# Patient Record
Sex: Female | Born: 1940 | Race: White | Hispanic: No | Marital: Married | Smoking: Never smoker
Health system: Southern US, Community
[De-identification: ages and names within clinical notes are randomized; demographics above are authoritative.]

## PROBLEM LIST (undated history)

## (undated) DIAGNOSIS — M810 Age-related osteoporosis without current pathological fracture: Secondary | ICD-10-CM

## (undated) DIAGNOSIS — G709 Myoneural disorder, unspecified: Secondary | ICD-10-CM

## (undated) DIAGNOSIS — H269 Unspecified cataract: Secondary | ICD-10-CM

## (undated) DIAGNOSIS — D689 Coagulation defect, unspecified: Secondary | ICD-10-CM

## (undated) DIAGNOSIS — E785 Hyperlipidemia, unspecified: Secondary | ICD-10-CM

## (undated) DIAGNOSIS — IMO0002 Reserved for concepts with insufficient information to code with codable children: Secondary | ICD-10-CM

## (undated) DIAGNOSIS — C801 Malignant (primary) neoplasm, unspecified: Secondary | ICD-10-CM

## (undated) DIAGNOSIS — J45909 Unspecified asthma, uncomplicated: Secondary | ICD-10-CM

## (undated) DIAGNOSIS — E079 Disorder of thyroid, unspecified: Secondary | ICD-10-CM

## (undated) DIAGNOSIS — I509 Heart failure, unspecified: Secondary | ICD-10-CM

## (undated) DIAGNOSIS — I10 Essential (primary) hypertension: Secondary | ICD-10-CM

## (undated) DIAGNOSIS — T7840XA Allergy, unspecified, initial encounter: Secondary | ICD-10-CM

## (undated) DIAGNOSIS — E119 Type 2 diabetes mellitus without complications: Secondary | ICD-10-CM

## (undated) DIAGNOSIS — K219 Gastro-esophageal reflux disease without esophagitis: Secondary | ICD-10-CM

## (undated) DIAGNOSIS — M1990 Unspecified osteoarthritis, unspecified site: Secondary | ICD-10-CM

## (undated) DIAGNOSIS — Z5189 Encounter for other specified aftercare: Secondary | ICD-10-CM

## (undated) DIAGNOSIS — I639 Cerebral infarction, unspecified: Secondary | ICD-10-CM

## (undated) HISTORY — DX: Disorder of thyroid, unspecified: E07.9

## (undated) HISTORY — DX: Unspecified cataract: H26.9

## (undated) HISTORY — DX: Age-related osteoporosis without current pathological fracture: M81.0

## (undated) HISTORY — DX: Unspecified osteoarthritis, unspecified site: M19.90

## (undated) HISTORY — DX: Allergy, unspecified, initial encounter: T78.40XA

## (undated) HISTORY — DX: Type 2 diabetes mellitus without complications: E11.9

## (undated) HISTORY — DX: Coagulation defect, unspecified: D68.9

## (undated) HISTORY — DX: Hyperlipidemia, unspecified: E78.5

## (undated) HISTORY — DX: Myoneural disorder, unspecified: G70.9

## (undated) HISTORY — PX: FOOT SURGERY: SHX648

## (undated) HISTORY — DX: Cerebral infarction, unspecified: I63.9

## (undated) HISTORY — DX: Essential (primary) hypertension: I10

## (undated) HISTORY — DX: Heart failure, unspecified: I50.9

## (undated) HISTORY — DX: Unspecified asthma, uncomplicated: J45.909

## (undated) HISTORY — DX: Malignant (primary) neoplasm, unspecified: C80.1

## (undated) HISTORY — DX: Encounter for other specified aftercare: Z51.89

## (undated) HISTORY — PX: KNEE SURGERY: SHX244

## (undated) HISTORY — DX: Reserved for concepts with insufficient information to code with codable children: IMO0002

## (undated) HISTORY — DX: Gastro-esophageal reflux disease without esophagitis: K21.9

---

## 2007-01-10 ENCOUNTER — Inpatient Hospital Stay (HOSPITAL_COMMUNITY): Admission: AD | Admit: 2007-01-10 | Discharge: 2007-01-13 | Payer: Self-pay

## 2007-01-12 ENCOUNTER — Encounter (INDEPENDENT_AMBULATORY_CARE_PROVIDER_SITE_OTHER): Payer: Self-pay | Admitting: Specialist

## 2011-01-17 NOTE — Op Note (Signed)
NAMETAYLORANN, TKACH NO.:  [id number]   MEDICAL RECORD NO.:  [id number]          PATIENT TYPE:  INP   LOCATION:  5012                         FACILITY:  [facility]   PHYSICIAN:  Alvan Dame, D.P.M. DATE OF BIRTH:  Sep 26, 1940   DATE OF PROCEDURE:  01/12/2007  DATE OF DISCHARGE:                               OPERATIVE REPORT   SURGEON:  Alvan Dame, D.P.M.   PREOPERATIVE DIAGNOSIS:  Osteomyelitis, third toe, right foot.   POSTOPERATIVE DIAGNOSIS:  Osteomyelitis, third toe, right foot.   OPERATIVE PROCEDURE:  Amputation third toe, right foot.   INDICATIONS FOR SURGERY:  The patient has had a several day history of  increased pain, tenderness, difficulty, and synovitis of her right foot  and right great toe with an ulceration being present for more than a  month.  The patient has recently been placed on antibiotics and admitted  for IV antibiotic therapy.  MRI has confirmed osteomyelitic change of  the distal aspects of the toe.  There looked like synovitis and  degenerative changes of the forefoot midfoot, although no extending  osteomyelitis.  At this time, per my recommendation and patient request,  amputation of the third toe right foot is to proceed.  No other  contraindications noted.   ANESTHESIA:  Managed anesthesia care, MAC, with local anesthetic  administered a total of 10 mL 0.5% Marcaine plain in a regional block  fashion.   HEMOSTASIS:  Use of a right ankle tourniquet, 250 mmHg, approximately 15  minutes.   FINDINGS AND PROCEDURES:  The patient is brought to the OR and placed on  the table in the supine position.  IV sedation was established.  Local  anesthetic was administered.  The foot was then examined.  The Esmarch  wrap and ankle tourniquet inflated to 250 mmHg.  The following procedure  was then carried out.  Amputation of the third toe, right foot, and  along the capillaries.  Attention was directed to the third digit of the  right  foot where a distal ulceration was identified.  The digit was  incised circumferentially at its base with preservation of intact  plantar skin to produce a slight flap for closure.  At this time, the  incision was made circumferentially around the base of the digit with  both dorsal and lateral flap preservation.  The incision was carried  down deeper to the level of the capsular periosteal structures of the  bone with hemostasis being acquired as necessary.  At this time, power  instrumentation was utilized to osteotomize the bone at the base of the  proximal phalanx.  The proximal phalanx and the entire third toe were  excised from the site and submitted for pathology analysis.  At this  time, cultures were taken, both aerobic and anaerobic cultures of  remaining amputation site.  This site was then lavaged with sterile  antibiotic solution and thoroughly lavaged and cleansed.  The closure  was accomplished as follows.  Vicryl 4-0 was utilized to reapproximate  capsule and periosteum over the stump portion of the phalanx and skin  was then reapproximated  with 4-0 nylon in a simple interrupted fashion.  Betadine, Adaptic, and a dry sterile compressive dressing were applied  to the right forefoot.  Ankle tourniquet deflated with immediate return  of perfusion to all remaining digits being noted at this time.   The patient is returned from the OR to recovery in satisfactory  condition.  Discharged per Anesthesia to room in the service of  [facility] team, Dr. Lavera Guise, for medical management.  The plan is for the  patient to be discharged to home either on IV or p.o. antibiotics within  24-48 hours.  The patient tolerated the procedure well and will be  monitored until discharged from hospital.           ______________________________  Alvan Dame, D.P.M.     RS/MEDQ  D:  01/12/2007  T:  01/12/2007  Job:  [id number]

## 2011-01-17 NOTE — H&P (Signed)
NAME:  Stephanie Brock, Stephanie Brock NO.:  [id number]   MEDICAL RECORD NO.:  [id number]          PATIENT TYPE:  INP   LOCATION:  5012                         FACILITY:  [facility]   PHYSICIAN:  Lonia Blood, M.D.       DATE OF BIRTH:  September 04, 1941   DATE OF ADMISSION:  01/10/2007  DATE OF DISCHARGE:                              HISTORY & PHYSICAL   PRIMARY CARE PHYSICIAN:  Stephanie Brock at [facility], which makes  the patient unassigned for [facility].   CHIEF COMPLAINT:  Right foot ulcer/4 pain.   HISTORY OF PRESENT ILLNESS:  Stephanie Brock is a 70 year old woman with  history of diabetes and chronic left foot ulcer who presented Stephanie Brock office yesterday with complaints of new onset right foot ulcer.  The patient was admitted to [facility] for possible amputation.  We were  called to fully evaluate the patient prior to the planned surgery.  The  patient reports that about 10 hours prior to admission she also noticed  that the right lower extremity was getting redder and that was getting  warmer, and also the redness and the warmth are spreading up the leg.  She did not think that that was something important to mention to Stephanie Brock, but showed up here at [facility] for the admission.  The patient  reports that she had an episode of chills about a couple days ago. She  denies any fever, and she denies any nausea, vomiting, or abdominal  pain.   PAST MEDICAL HISTORY:  1. Diabetes mellitus type 2, with neuropathy, now insulin requiring.  2. Hypertension.  3. Hyperlipidemia.  4. Hypothyroidism.  5. CVA in 2006, with right hemiparesis, almost completely recovered.  6. Obesity.  7. Diastolic heart failure.  8. History of chest pain, with a cardiac catheterization that was      about 8 years ago, and it was clean.  9. Tonsillectomy.  10.Ovarian cyst rupture  11.Appendectomy.  12.Bilateral tubal ligation.  13.History of complex foot surgeries on the right foot  about 17 years      ago.  14.Restless leg syndrome.  15.Obstructive sleep apnea.  16.Chronic ulcer of the left foot.   HOME MEDICATIONS:  1. Ramipril 2.5 mg by mouth twice a day.  2. Isosorbide 40 mg by mouth twice a day.  3. Lasix 120 mg daily.  4. Triamterene/HCTZ 25/37.5 mg once a day.  5. Synthroid 75 mcg daily  6. Protonix 40 mg at bedtime.  7. Metoprolol 200 mg daily.  8. Spironolactone 25 mg daily.  9. Aggrenox 1 tablet twice a day.  10.Lyrica 100 mg daily.  11.Neurontin 600 mg 3 times a day.  12.Potassium twice a day.  13.Lantus 54 units at bedtime.  14.NovoLog sliding scale with meals.  15.Avelox 400 mg daily.  16.Septra Double Strength 1 tablet twice a day.   SOCIAL HISTORY:  The patient lives with her second husband in [place], [place].  She is retired.  She does not smoke cigarettes,  does not drink alcohol.  She has three grown-up  children.  She is still  very active in her community.   FAMILY HISTORY:  Positive for coronary artery disease, diabetes in the  father, positive for emphysema in the mother. Both her parents are  deceased.  The patient has three siblings.  They have various medical  problems, and her younger sister has diabetes.   REVIEW OF SYSTEMS:  As per HPI.  Also, the patient reports occasional  chest pain that does not seem to be effort related.  She also reports  dyspnea after seeing significant exertion.  She reports occasional  headaches.  Other systems are negative.   PHYSICAL EXAMINATION UPON ADMISSION:  VITAL SIGNS:  Temperature 97.1,  heart rate 68, respirations 20, blood pressure 95/58, saturation 94% on  room air.  GENERAL APPEARANCE:  An obese, alert, and oriented woman, in no acute  distress, sitting in bed.  She is oriented to place, person time, and  situation.  HEENT:  Her head appears normocephalic, atraumatic.  Eyes: Pupils equal,  round, and reactive to light and accommodation.  Extraocular movements  intact.   Throat clear.  NECK:  Supple.  No JVD.  No carotid bruits.  CHEST:  Clear to auscultation bilaterally, without wheezes, rhonchi, or  crackles.  HEART:  Regular rate and rhythm, without murmurs, rubs, or gallops.  ABDOMEN:  Obese, soft, nontender.  Bowel sounds are present.  LOWER EXTREMITIES: The right lower extremity has +1 edema and is warm to  touch.  There is erythema extending two-thirds up through the leg.  DP  and PT pulses are present and bouncing. On the right foot and third toe,  there is a shallow area of ulceration, with cellulitis right on the tip  of the toe. On the left lower extremity, there is no erythema, no  warmth, but there is a +1 edema.  Pulses are present and good in the  left lower extremity as well.  NEUROLOGIC:  Cranial nerves III-XII seem to be intact.  There might be a  tinge of a slight left facial droop. The right side has 4.5/5 strength,  compared to 5/5 in the left side.  Gait seems to be wide based, but  otherwise within normal limits.   The patient's labs are all pending currently.   ASSESSMENT AND PLAN:  1. Right third toe probable osteomyelitis, with right lower extremity      cellulitis.  Stephanie Brock will be admitted to the acute care unit of      [facility].  Blood cultures and wound culture will be      obtained.  We will cover the patient empirically with vancomycin      and Avelox, to cover for MRSA and gram-negative rods.  The      patient's right foot will be imaged using an MRI to look for      osteomyelitis.  We will also follow clinically the response of the      cellulitis to the intravenous antibiotic..  2. Diabetes mellitus.  This probably is under good control, given the      patient's medical compliance.  We will obtain a hemoglobin A1c is      to further document that, and keep the patient on sliding scale      insulin and Lantus during her hospitalization. 3. History of chest pains. It is conceivable that Stephanie Brock may have  a      degree of chest pain.  She does seem to be on maximal  medical      therapy, and currently her situation seems to be stable.  I will      obtain an EKG to have a baseline, but I would say that at this      point in time she should be safe to undergo amputation if      necessary.  For now, I will continue the patient's beta blocker,      aspirin, and not use the diuretics until we get a better picture of      how the cellulitis will react.  The patient is considerably at risk      of sepsis, and we do not want her to be hypotensive from all the      diuretics that we will be giving her.  4. History of a stroke.  The patient's neurological exam seems to be      at baseline, according to her. I will continue her Aggrenox and add      also low-dose aspirin for cardioprotection.  5. Obstructive sleep apnea.  Ms. Modeste will be placed back on CPAP at      bedtime as she was supposed to be.      Lonia Blood, M.D.  Electronically Signed     SL/MEDQ  D:  01/10/2007  T:  01/11/2007  Job:  [id number]   cc:   Alvan Dame, D.P.M.

## 2011-01-17 NOTE — Discharge Summary (Signed)
Stephanie Brock, Stephanie Brock NO.:  [id number]   MEDICAL RECORD NO.:  [id number]          PATIENT TYPE:  INP   LOCATION:  5012                         FACILITY:  [facility]   PHYSICIAN:  Lonia Blood, M.D.       DATE OF BIRTH:  1940-10-18   DATE OF ADMISSION:  01/10/2007  DATE OF DISCHARGE:  01/13/2007                               DISCHARGE SUMMARY   PRIMARY CARE PHYSICIAN:  Dr. Danae Chen at [facility].   DISCHARGE DIAGNOSES:  1. Right foot third toe osteomyelitis.  2. Cellulitis of the right lower extremity.  Resolved.  3. Diabetes mellitus type 2, uncontrolled.  4. Diabetic neuropathy.  5. Hypertension.  6. Hyperlipidemia.  7. Hypothyroidism.  8. Status post stroke.  9. Obesity.  10.Diastolic heart failure.  11.Restless legs syndrome.  12.Obstructive sleep apnea.   DISCHARGE MEDICATIONS:  1. Enalapril  2.5 mg per mouth twice a day.  2. Isosorbide 40 mg by mouth twice a day.  3. Lasix 80 mg daily.  4. Synthroid 75 mcg daily.  5. Protonix 40 mg daily.  6. Metoprolol 200 mg daily.  7. Aggrenox 1 tablet twice a day.  8. Lyrica 100 mg three times a day.  9. Neurontin 600 mg three times a day.  10.Potassium chloride twice a day.  11.Lantus 60 units at bedtime.  12.NovoLog sliding scale insulin with each meal.  13.Septra double-strength 2 tablets by mouth twice a day.   CONDITION ON DISCHARGE:  Stephanie Brock was discharged in good condition.   At time of discharge she was instructed to follow up with Dr. Ralene Cork and  with Dr. Danae Chen.   PROCEDURES DURING THIS ADMISSION:  1. On Jan 11, 2007, the patient underwent a MRI of the right lower      extremity with findings of osteomyelitis and cellulitis involving      the third digit of the right foot.  2. Jan 12, 2007, amputation of the third toe of the right foot by Dr.      Ralene Cork.   CONSULTATIONS DURING THIS ADMISSION:  The patient was seen consultation  by Dr. Ralene Cork from podiatry.   HOSPITAL COURSE:  1.  Cellulitis and osteomyelitis of the right lower extremity.  Mrs.      Brock was admitted to acute care at [facility].  The      patient had blood culture and urine culture obtained, the results      of which are still pending.  Stephanie Brock was treated empirically      with vancomycin and Avelox and she had a course of quick recovery.      She underwent amputation of the third toe with a good postoperative      course.  Stephanie Brock was switched to oral antibiotics as her      cellulitis is resolving.  She will complete a 1-week course of      Septra which should be good coverage empirically for this patient's      problem.  2. Diabetes mellitus.  Poorly controlled with an admitting hemoglobin  A1C of 8.  I discussed with Stephanie Brock dietary compliance, losing      weight, and I have increased her dose of Lantus.  3. Hyperlipidemia.  Stephanie Brock is not on a Statin.  She reports that      she has been on a Statin before, but her cholesterol got extremely      low, so her primary care physician took her off.  Stephanie Brock just      had a routine fasting lipid panel through her primary care      physician's office and she is due for a follow up visit next week.      I have encouraged the patient to discuss the results of the fasting      lipid panel with her primary care physician and to strongly      consider using a Statin, as she is a diabetic who already had a      stroke.  4. Multiple chronic medical problems including hypertension, diastolic      heart failure, hypothyroidism.  All these problems have been stable      throughout this admission without any requirement of changing of      any medications.      Lonia Blood, M.D.  Electronically Signed     SL/MEDQ  D:  01/13/2007  T:  01/13/2007  Job:  [id number]

## 2013-06-04 ENCOUNTER — Ambulatory Visit (INDEPENDENT_AMBULATORY_CARE_PROVIDER_SITE_OTHER): Payer: Medicare Other

## 2013-06-04 VITALS — BP 133/114 | HR 70 | Temp 97.1°F | Resp 16 | Ht 64.0 in | Wt 198.0 lb

## 2013-06-04 DIAGNOSIS — E1049 Type 1 diabetes mellitus with other diabetic neurological complication: Secondary | ICD-10-CM

## 2013-06-04 DIAGNOSIS — L97919 Non-pressure chronic ulcer of unspecified part of right lower leg with unspecified severity: Secondary | ICD-10-CM

## 2013-06-04 DIAGNOSIS — L97909 Non-pressure chronic ulcer of unspecified part of unspecified lower leg with unspecified severity: Secondary | ICD-10-CM

## 2013-06-04 DIAGNOSIS — E1040 Type 1 diabetes mellitus with diabetic neuropathy, unspecified: Secondary | ICD-10-CM

## 2013-06-04 DIAGNOSIS — L97509 Non-pressure chronic ulcer of other part of unspecified foot with unspecified severity: Secondary | ICD-10-CM

## 2013-06-04 DIAGNOSIS — B351 Tinea unguium: Secondary | ICD-10-CM

## 2013-06-04 NOTE — Progress Notes (Signed)
Mrs. Mack is seen for f/u of ulcers both feet.  Sub 4th met ulcer on left resolved, no drainage. Sub 1st ulcer 1 cm diameter .3cm depth, still draining, with macerated keratosis build up. Vitals stable , afebrile. With warm right lower leg, and hx of cellulitis. A/p Diabetic ulcer sub 1 right.   Debridement, sharp of keratosis and necrotic tissue. silvadine and dsd applied. rov 2 weelks. Pt reports that she stopped her antibiotics,  Will resume per insrtucions today, Doxycycline 100 mg. Bid.  Tangelia Sanson [employer]

## 2013-06-04 NOTE — Patient Instructions (Signed)

## 2013-06-18 ENCOUNTER — Ambulatory Visit (INDEPENDENT_AMBULATORY_CARE_PROVIDER_SITE_OTHER): Payer: Medicare Other

## 2013-06-18 VITALS — BP 112/53 | HR 65 | Resp 16

## 2013-06-18 DIAGNOSIS — L97509 Non-pressure chronic ulcer of other part of unspecified foot with unspecified severity: Secondary | ICD-10-CM

## 2013-06-18 DIAGNOSIS — E1142 Type 2 diabetes mellitus with diabetic polyneuropathy: Secondary | ICD-10-CM

## 2013-06-18 DIAGNOSIS — E1049 Type 1 diabetes mellitus with other diabetic neurological complication: Secondary | ICD-10-CM

## 2013-06-18 DIAGNOSIS — E1040 Type 1 diabetes mellitus with diabetic neuropathy, unspecified: Secondary | ICD-10-CM

## 2013-06-18 NOTE — Progress Notes (Signed)
  Subjective:    Patient ID: Stephanie Brock, female    DOB: 1940-09-07, 72 y.o.   MRN: [id number]  HPI right foot is still draining and bleeding and is sore and tender when i get up and the left foot is good    Review of Systems  Constitutional: Negative.   HENT: Negative.   Eyes: Negative.   Respiratory: Negative.   Cardiovascular: Negative.   Gastrointestinal: Negative.   Endocrine: Negative.   Genitourinary: Negative.   Musculoskeletal: Negative.   Skin: Negative.   Allergic/Immunologic: Negative.   Neurological: Negative.   Hematological: Negative.   Psychiatric/Behavioral: Negative.        Objective:   Physical Exam  Constitutional: She is oriented to person, place, and time. She appears well-developed and well-nourished.  Cardiovascular:  Pulses:      Dorsalis pedis pulses are 2+ on the right side, and 2+ on the left side.       Posterior tibial pulses are 0 on the right side, and 0 on the left side.  Very refill 3-4 seconds all digits. Moderate varicosities bilateral. Mild edema +1 right more so than the  Musculoskeletal:  Severe rigid digital contractures 2345 bilateral severe HAV deformity and hallux malleus deformities bilateral with interphalangeus both hallux. Severe osteoarthrosis as well as bunion deformity and hammertoe deformities bilateral plantarflexed metatarsal associate ulceration sub-first right ulceration sub-fourth left resolve at this time  Neurological: She is alert and oriented to person, place, and time. She has normal strength.  Grossly significant neuropathy with absent epicritic and proprioceptive sensations bilateral. Abnormal function is well bilateral. DTRs not was  Skin: Skin is warm and dry. No cyanosis. Nails show no clubbing.  Nails criptotic with friability discoloration and brittleness consistent onychomycosis bilateral there is a healed ulceration sub-fourth MTP her left with some surrounding keratoses. There still is a 1 x 1.4 cm  ulceration a half centimeter depth sub-first MTP area right. There is some maceration in keratoses which is debrided at this time. No malodor no ascending cellulitis  Psychiatric: She has a normal mood and affect. Her behavior is normal.          Assessment & Plan:  Diabetic neuropathic ulceration sub-1 right stable minimal change in size. Ulceration sub-fourth left is resolved in stable. 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 The ulcer site was debrided dressed with BioSorb and gauze dressing sub-1 right recheck in 2 weeks for followup. We'll consider a wounds or alternative her skin substitute at future followup visits. Maintain offloading with appropriate shoes and ankle bracing.  Alvan Dame to the

## 2013-06-18 NOTE — Patient Instructions (Signed)

## 2013-06-23 ENCOUNTER — Inpatient Hospital Stay (HOSPITAL_COMMUNITY)
Admission: EM | Admit: 2013-06-23 | Discharge: 2013-06-26 | DRG: 617 | Disposition: A | Discharge status: Home Health | Payer: Medicare Other | Attending: Internal Medicine | Admitting: Internal Medicine

## 2013-06-23 ENCOUNTER — Inpatient Hospital Stay (HOSPITAL_COMMUNITY): Payer: Medicare Other

## 2013-06-23 ENCOUNTER — Emergency Department (HOSPITAL_COMMUNITY): Payer: Medicare Other

## 2013-06-23 ENCOUNTER — Telehealth: Payer: Self-pay | Admitting: *Deleted

## 2013-06-23 ENCOUNTER — Encounter (HOSPITAL_COMMUNITY): Payer: Self-pay | Admitting: Emergency Medicine

## 2013-06-23 DIAGNOSIS — E1142 Type 2 diabetes mellitus with diabetic polyneuropathy: Secondary | ICD-10-CM | POA: Diagnosis present

## 2013-06-23 DIAGNOSIS — L03039 Cellulitis of unspecified toe: Secondary | ICD-10-CM | POA: Diagnosis present

## 2013-06-23 DIAGNOSIS — E785 Hyperlipidemia, unspecified: Secondary | ICD-10-CM | POA: Diagnosis present

## 2013-06-23 DIAGNOSIS — M810 Age-related osteoporosis without current pathological fracture: Secondary | ICD-10-CM | POA: Diagnosis present

## 2013-06-23 DIAGNOSIS — Z880 Allergy status to penicillin: Secondary | ICD-10-CM

## 2013-06-23 DIAGNOSIS — IMO0001 Reserved for inherently not codable concepts without codable children: Secondary | ICD-10-CM

## 2013-06-23 DIAGNOSIS — E1140 Type 2 diabetes mellitus with diabetic neuropathy, unspecified: Secondary | ICD-10-CM

## 2013-06-23 DIAGNOSIS — Z794 Long term (current) use of insulin: Secondary | ICD-10-CM

## 2013-06-23 DIAGNOSIS — E11628 Type 2 diabetes mellitus with other skin complications: Secondary | ICD-10-CM | POA: Diagnosis present

## 2013-06-23 DIAGNOSIS — L02619 Cutaneous abscess of unspecified foot: Secondary | ICD-10-CM | POA: Diagnosis present

## 2013-06-23 DIAGNOSIS — L0390 Cellulitis, unspecified: Secondary | ICD-10-CM

## 2013-06-23 DIAGNOSIS — D649 Anemia, unspecified: Secondary | ICD-10-CM

## 2013-06-23 DIAGNOSIS — E119 Type 2 diabetes mellitus without complications: Secondary | ICD-10-CM

## 2013-06-23 DIAGNOSIS — I509 Heart failure, unspecified: Secondary | ICD-10-CM

## 2013-06-23 DIAGNOSIS — Z8673 Personal history of transient ischemic attack (TIA), and cerebral infarction without residual deficits: Secondary | ICD-10-CM

## 2013-06-23 DIAGNOSIS — E1169 Type 2 diabetes mellitus with other specified complication: Principal | ICD-10-CM | POA: Diagnosis present

## 2013-06-23 DIAGNOSIS — L97509 Non-pressure chronic ulcer of other part of unspecified foot with unspecified severity: Secondary | ICD-10-CM | POA: Diagnosis present

## 2013-06-23 DIAGNOSIS — K219 Gastro-esophageal reflux disease without esophagitis: Secondary | ICD-10-CM

## 2013-06-23 DIAGNOSIS — E039 Hypothyroidism, unspecified: Secondary | ICD-10-CM

## 2013-06-23 DIAGNOSIS — I5032 Chronic diastolic (congestive) heart failure: Secondary | ICD-10-CM

## 2013-06-23 DIAGNOSIS — L089 Local infection of the skin and subcutaneous tissue, unspecified: Secondary | ICD-10-CM

## 2013-06-23 DIAGNOSIS — E1149 Type 2 diabetes mellitus with other diabetic neurological complication: Secondary | ICD-10-CM | POA: Diagnosis present

## 2013-06-23 DIAGNOSIS — Z79899 Other long term (current) drug therapy: Secondary | ICD-10-CM

## 2013-06-23 DIAGNOSIS — E11621 Type 2 diabetes mellitus with foot ulcer: Secondary | ICD-10-CM

## 2013-06-23 DIAGNOSIS — I10 Essential (primary) hypertension: Secondary | ICD-10-CM

## 2013-06-23 LAB — CBC WITH DIFFERENTIAL/PLATELET
Basophils Absolute: 0 10*3/uL (ref 0.0–0.1)
Eosinophils Relative: 1 % (ref 0–5)
Hemoglobin: 10.9 g/dL — ABNORMAL LOW (ref 12.0–15.0)
Lymphocytes Relative: 11 % — ABNORMAL LOW (ref 12–46)
Lymphs Abs: 1.6 10*3/uL (ref 0.7–4.0)
MCH: 30.9 pg (ref 26.0–34.0)
MCHC: 32.3 g/dL (ref 30.0–36.0)
Monocytes Absolute: 1.5 10*3/uL — ABNORMAL HIGH (ref 0.1–1.0)
RBC: 3.53 MIL/uL — ABNORMAL LOW (ref 3.87–5.11)

## 2013-06-23 LAB — BASIC METABOLIC PANEL
BUN: 13 mg/dL (ref 6–23)
CO2: 27 mEq/L (ref 19–32)
Calcium: 9.4 mg/dL (ref 8.4–10.5)
Creatinine, Ser: 1.05 mg/dL (ref 0.50–1.10)
Glucose, Bld: 143 mg/dL — ABNORMAL HIGH (ref 70–99)

## 2013-06-23 LAB — HEMOGLOBIN A1C
Hgb A1c MFr Bld: 8 % — ABNORMAL HIGH (ref <5.7)
Mean Plasma Glucose: 183 mg/dL — ABNORMAL HIGH (ref <117)

## 2013-06-23 LAB — C-REACTIVE PROTEIN: CRP: 29.8 mg/dL — ABNORMAL HIGH (ref <0.60)

## 2013-06-23 MED ORDER — HEPARIN SODIUM (PORCINE) 5000 UNIT/ML IJ SOLN
5000.0000 [IU] | Freq: Three times a day (TID) | INTRAMUSCULAR | Status: DC
  Administered 2013-06-23 – 2013-06-26 (×7): 5000 [IU] via SUBCUTANEOUS
  Filled 2013-06-23 (×12): qty 1

## 2013-06-23 MED ORDER — ACETAMINOPHEN 325 MG PO TABS
650.0000 mg | ORAL_TABLET | Freq: Once | ORAL | Status: AC
  Administered 2013-06-23: 650 mg via ORAL
  Filled 2013-06-23: qty 2

## 2013-06-23 MED ORDER — INFLUENZA VAC SPLIT QUAD 0.5 ML IM SUSP
0.5000 mL | INTRAMUSCULAR | Status: AC
  Administered 2013-06-24: 0.5 mL via INTRAMUSCULAR
  Filled 2013-06-23: qty 0.5

## 2013-06-23 MED ORDER — SIMVASTATIN 5 MG PO TABS
5.0000 mg | ORAL_TABLET | Freq: Every day | ORAL | Status: DC
Start: 2013-06-23 — End: 2013-06-26
  Administered 2013-06-23 – 2013-06-25 (×3): 5 mg via ORAL
  Filled 2013-06-23 (×5): qty 1

## 2013-06-23 MED ORDER — FUROSEMIDE 80 MG PO TABS
80.0000 mg | ORAL_TABLET | Freq: Every day | ORAL | Status: DC
  Administered 2013-06-24 – 2013-06-26 (×3): 80 mg via ORAL
  Filled 2013-06-23 (×3): qty 1

## 2013-06-23 MED ORDER — VANCOMYCIN HCL IN DEXTROSE 1-5 GM/200ML-% IV SOLN
1000.0000 mg | Freq: Once | INTRAVENOUS | Status: AC
  Administered 2013-06-23: 1000 mg via INTRAVENOUS
  Filled 2013-06-23: qty 200

## 2013-06-23 MED ORDER — SPIRONOLACTONE 100 MG PO TABS
100.0000 mg | ORAL_TABLET | Freq: Every day | ORAL | Status: DC
  Administered 2013-06-23 – 2013-06-26 (×4): 100 mg via ORAL
  Filled 2013-06-23 (×4): qty 1

## 2013-06-23 MED ORDER — IPRATROPIUM BROMIDE 0.06 % NA SOLN
2.0000 | Freq: Two times a day (BID) | NASAL | Status: DC
  Administered 2013-06-23 – 2013-06-26 (×4): 2 via NASAL
  Filled 2013-06-23: qty 15

## 2013-06-23 MED ORDER — ROPINIROLE HCL 1 MG PO TABS
1.0000 mg | ORAL_TABLET | Freq: Every morning | ORAL | Status: DC
  Administered 2013-06-24 – 2013-06-26 (×3): 1 mg via ORAL
  Filled 2013-06-23 (×3): qty 1

## 2013-06-23 MED ORDER — VANCOMYCIN HCL 10 G IV SOLR
1500.0000 mg | INTRAVENOUS | Status: DC
  Administered 2013-06-24 – 2013-06-25 (×2): 1500 mg via INTRAVENOUS
  Filled 2013-06-23 (×3): qty 1500

## 2013-06-23 MED ORDER — PANTOPRAZOLE SODIUM 40 MG PO TBEC
40.0000 mg | DELAYED_RELEASE_TABLET | Freq: Every day | ORAL | Status: DC
  Administered 2013-06-23 – 2013-06-26 (×4): 40 mg via ORAL
  Filled 2013-06-23 (×4): qty 1

## 2013-06-23 MED ORDER — ACETAMINOPHEN 325 MG PO TABS
650.0000 mg | ORAL_TABLET | Freq: Four times a day (QID) | ORAL | Status: DC | PRN
  Administered 2013-06-24 – 2013-06-26 (×4): 650 mg via ORAL
  Filled 2013-06-23 (×5): qty 2

## 2013-06-23 MED ORDER — POTASSIUM CHLORIDE CRYS ER 20 MEQ PO TBCR
40.0000 meq | EXTENDED_RELEASE_TABLET | Freq: Two times a day (BID) | ORAL | Status: DC
  Administered 2013-06-23 – 2013-06-26 (×7): 40 meq via ORAL
  Filled 2013-06-23 (×7): qty 2

## 2013-06-23 MED ORDER — OXYCODONE HCL 5 MG PO TABS
5.0000 mg | ORAL_TABLET | ORAL | Status: DC | PRN
  Administered 2013-06-24 – 2013-06-25 (×2): 5 mg via ORAL
  Filled 2013-06-23 (×2): qty 1

## 2013-06-23 MED ORDER — METOPROLOL SUCCINATE ER 50 MG PO TB24
50.0000 mg | ORAL_TABLET | Freq: Every day | ORAL | Status: DC
  Administered 2013-06-24 – 2013-06-26 (×3): 50 mg via ORAL
  Filled 2013-06-23 (×3): qty 1

## 2013-06-23 MED ORDER — SENNOSIDES-DOCUSATE SODIUM 8.6-50 MG PO TABS
1.0000 | ORAL_TABLET | Freq: Every evening | ORAL | Status: DC | PRN
  Filled 2013-06-23: qty 1

## 2013-06-23 MED ORDER — LEVOTHYROXINE SODIUM 75 MCG PO TABS
75.0000 ug | ORAL_TABLET | Freq: Every day | ORAL | Status: DC
  Administered 2013-06-24 – 2013-06-26 (×3): 75 ug via ORAL
  Filled 2013-06-23 (×5): qty 1

## 2013-06-23 MED ORDER — GABAPENTIN 600 MG PO TABS
600.0000 mg | ORAL_TABLET | Freq: Every day | ORAL | Status: DC
  Administered 2013-06-23 – 2013-06-25 (×3): 600 mg via ORAL
  Filled 2013-06-23 (×4): qty 1

## 2013-06-23 MED ORDER — GADOBENATE DIMEGLUMINE 529 MG/ML IV SOLN
19.0000 mL | Freq: Once | INTRAVENOUS | Status: AC | PRN
  Administered 2013-06-23: 19 mL via INTRAVENOUS

## 2013-06-23 MED ORDER — ONDANSETRON HCL 4 MG PO TABS: 4.0000 mg | ORAL_TABLET | Freq: Four times a day (QID) | ORAL | Status: DC | PRN

## 2013-06-23 MED ORDER — INSULIN ASPART PROT & ASPART (70-30 MIX) 100 UNIT/ML ~~LOC~~ SUSP
60.0000 [IU] | Freq: Two times a day (BID) | SUBCUTANEOUS | Status: DC
  Administered 2013-06-23 – 2013-06-26 (×4): 60 [IU] via SUBCUTANEOUS
  Filled 2013-06-23 (×2): qty 10

## 2013-06-23 MED ORDER — PREGABALIN 100 MG PO CAPS: 100.0000 mg | ORAL_CAPSULE | Freq: Two times a day (BID) | ORAL | Status: DC | PRN

## 2013-06-23 MED ORDER — SODIUM CHLORIDE 0.9 % IV SOLN
500.0000 mg | Freq: Three times a day (TID) | INTRAVENOUS | Status: DC
  Administered 2013-06-23 – 2013-06-25 (×6): 500 mg via INTRAVENOUS
  Filled 2013-06-23 (×8): qty 500

## 2013-06-23 MED ORDER — ONDANSETRON HCL 4 MG/2ML IJ SOLN
4.0000 mg | Freq: Four times a day (QID) | INTRAMUSCULAR | Status: DC | PRN
  Administered 2013-06-24 – 2013-06-25 (×2): 4 mg via INTRAVENOUS
  Filled 2013-06-23 (×2): qty 2

## 2013-06-23 MED ORDER — ROPINIROLE HCL 1 MG PO TABS
3.0000 mg | ORAL_TABLET | Freq: Every morning | ORAL | Status: DC
  Administered 2013-06-24 – 2013-06-26 (×3): 3 mg via ORAL
  Filled 2013-06-23 (×3): qty 3

## 2013-06-23 MED ORDER — ASPIRIN-DIPYRIDAMOLE ER 25-200 MG PO CP12
1.0000 | ORAL_CAPSULE | Freq: Two times a day (BID) | ORAL | Status: DC
  Administered 2013-06-23 – 2013-06-26 (×7): 1 via ORAL
  Filled 2013-06-23 (×8): qty 1

## 2013-06-23 NOTE — ED Notes (Signed)
Pt c/o right foot wound with drainage and possible infection; pt denies pain

## 2013-06-23 NOTE — Progress Notes (Signed)
ANTIBIOTIC CONSULT NOTE - INITIAL  Pharmacy Consult for Vancomycin Indication: Diabetic foot infection  Allergies  Allergen Reactions  . Iodine   . Penicillins     whelps  . Shrimp [Shellfish Allergy]     Whelps   . Tape   . Codeine Rash    Patient Measurements: Height: 5\' 4"  (162.6 cm) Weight: 195 lb 1.7 oz (88.5 kg) IBW/kg (Calculated) : 54.7 Adjusted Body Weight:   Vital Signs: Temp: 98.2 F (36.8 C) (10/20 1530) Temp src: Oral (10/20 1530) BP: 127/57 mmHg (10/20 1530) Pulse Rate: 68 (10/20 1530) Intake/Output from previous day:   Intake/Output from this shift:    Labs:  Recent Labs  06/23/13 1241  WBC 14.0*  HGB 10.9*  PLT 241  CREATININE 1.05   Estimated Creatinine Clearance: 51.4 ml/min (by C-G formula based on Cr of 1.05). No results found for this basename: VANCOTROUGH, VANCOPEAK, VANCORANDOM, GENTTROUGH, GENTPEAK, GENTRANDOM, TOBRATROUGH, TOBRAPEAK, TOBRARND, AMIKACINPEAK, AMIKACINTROU, AMIKACIN,  in the last 72 hours   Microbiology: No results found for this or any previous visit (from the past 720 hour(s)).  Medical History: Past Medical History  Diagnosis Date  . Diabetes mellitus without complication   . Allergy   . Arthritis   . Asthma   . Blood transfusion without reported diagnosis   . Cancer   . Cataract   . Clotting disorder   . CHF (congestive heart failure)   . GERD (gastroesophageal reflux disease)   . Hyperlipidemia   . Hypertension   . Neuromuscular disorder   . Osteoporosis   . Stroke   . Thyroid disease   . Ulcer     Medications:  Prescriptions prior to admission  Medication Sig Dispense Refill  . acetaminophen (TYLENOL) 500 MG tablet Take 500-1,000 mg by mouth 2 (two) times daily after a meal. Take 2 tablets a bedtime and 1 during the day if needed      . dipyridamole-aspirin (AGGRENOX) 200-25 MG per 12 hr capsule Take 1 capsule by mouth 2 (two) times daily.      . furosemide (LASIX) 80 MG tablet Take 80 mg by  mouth daily.       Marland Kitchen gabapentin (NEURONTIN) 600 MG tablet Take 600 mg by mouth at bedtime.       . insulin lispro protamine-lispro (HUMALOG 75/25) (75-25) 100 UNIT/ML SUSP injection Inject 60 Units into the skin 2 (two) times daily with a meal.      . ipratropium (ATROVENT) 0.06 % nasal spray Place 2 sprays into the nose 2 (two) times daily.      Marland Kitchen levothyroxine (SYNTHROID, LEVOTHROID) 75 MCG tablet Take 75 mcg by mouth daily before breakfast.      . metoprolol (TOPROL-XL) 200 MG 24 hr tablet Take 200 mg by mouth daily.      . pantoprazole (PROTONIX) 40 MG tablet Take 40 mg by mouth daily.      . potassium chloride SA (K-DUR,KLOR-CON) 20 MEQ tablet Take 40 mEq by mouth 2 (two) times daily.      . pregabalin (LYRICA) 100 MG capsule Take 100 mg by mouth 2 (two) times daily as needed (pt takes 1 capsule at bedtime and may take 1 capsule after luinch if needed).      Marland Kitchen rOPINIRole (REQUIP) 1 MG tablet Take 1 mg by mouth every morning. Total of 4mg = take with 3mg  tablet      . rOPINIRole (REQUIP) 3 MG tablet Take 3 mg by mouth every morning. Total of 4mg = take  with 3mg  tablet      . silver sulfADIAZINE (SILVADENE) 1 % cream Apply 1 application topically daily.      . simvastatin (ZOCOR) 5 MG tablet Take 5 mg by mouth at bedtime.      Marland Kitchen spironolactone (ALDACTONE) 100 MG tablet Take 100 mg by mouth daily.       Assessment: 72yof to receive Vancomycin for diabetic foot infection. Patient received Vancomycin 1g in ED ~1330. - Tmax 101.3, WBC 14 - Weight: 88.5kg, CrCl 51.4 ml/min - No evidence of osteomyelitis per Xray  Goal of Therapy:  Vancomycin trough level 10-15 mcg/ml  Plan:  1. Vancomycin 1.5g IV q24h 2. Monitor renal function, cultures, imaging, clinical course and adjust as indicated  Cleon Dew [phone] 06/23/2013,4:38 PM

## 2013-06-23 NOTE — Progress Notes (Signed)
Patient has arrived to unit from ED, report received from ED nurse, patient is stable and attending MD is currently in the patient's room; will continue to monitor patient. Lorretta Harp RN

## 2013-06-23 NOTE — ED Notes (Signed)
C/o ulcer to bottom of right foot x 2 months presently being treated for by a doctor. States was treated for cellulitis 3 weeks ago & finished course of antibiotics a week ago. C/o fever, chills, RLE edema to below knee, skin red, hot to touch & increased serous drainage since yesterday. Called MD today & was informed to go to ED

## 2013-06-23 NOTE — Telephone Encounter (Signed)
Pt complains of Right foot swollen, red, with pus and chills and fever of 1 - 2 days.  Pt states has history of celllulitis, and toe amputation in Right foot and leg.  I directed pt to the ER, she requested I call [place].  I contacted ER [phone] informed Shanda Bumps of pt's symptom and arrival with in 1.5 hrs.

## 2013-06-23 NOTE — H&P (Signed)
[facility] History and Physical  Leonardo Plaia [name] DOB: 1941/04/06 DOA: 06/23/2013  Referring physician: EDP PCP: Mackie Pai, MD  Specialists: Ralene Cork  Chief Complaint: foot infection  HPI: Stephanie Brock is a 72 y.o. female with DM and neuropathy who presents with right foot infection.  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 Ralene Cork. Had redness, malodorous drainage and blistering, which burst.  Also, F/C.  Multiple medical problems stable. In ed, temp 101.3 WBC 14K.  Xray shows no osteo or foreign body.  Review of Systems: systems reviewed.  As above, otherwise negative  Past Medical History  Diagnosis Date  . Diabetes mellitus without complication   . Allergy   . Arthritis   . Asthma   . Blood transfusion without reported diagnosis   . Cancer   . Cataract   . Clotting disorder   . CHF (congestive heart failure)   . GERD (gastroesophageal reflux disease)   . Hyperlipidemia   . Hypertension   . Neuromuscular disorder   . Osteoporosis   . Stroke   . Thyroid disease   . Ulcer    Past Surgical History  Procedure Laterality Date  . Knee surgery      bi-lateral   Social History:  reports that she has never smoked. She has never used smokeless tobacco. She reports that she does not drink alcohol or use illicit drugs.  Allergies  Allergen Reactions  . Iodine   . Penicillins     whelps  . Shrimp [Shellfish Allergy]     Whelps   . Tape   . Codeine Rash    History reviewed. No pertinent family history.   Prior to Admission medications   Medication Sig Start Date End Date Taking? Authorizing Provider  acetaminophen (TYLENOL) 500 MG tablet Take 500-1,000 mg by mouth 2 (two) times daily after a meal. Take 2 tablets a bedtime and 1 during the day if needed   Yes Historical Provider, MD  dipyridamole-aspirin (AGGRENOX) 200-25 MG per 12 hr capsule Take 1 capsule by mouth 2 (two) times daily.   Yes Historical  Provider, MD  furosemide (LASIX) 80 MG tablet Take 80 mg by mouth daily.    Yes Historical Provider, MD  gabapentin (NEURONTIN) 600 MG tablet Take 600 mg by mouth at bedtime.    Yes Historical Provider, MD  insulin lispro protamine-lispro (HUMALOG 75/25) (75-25) 100 UNIT/ML SUSP injection Inject 60 Units into the skin 2 (two) times daily with a meal.   Yes Historical Provider, MD  ipratropium (ATROVENT) 0.06 % nasal spray Place 2 sprays into the nose 2 (two) times daily.   Yes Historical Provider, MD  levothyroxine (SYNTHROID, LEVOTHROID) 75 MCG tablet Take 75 mcg by mouth daily before breakfast.   Yes Historical Provider, MD  metoprolol (TOPROL-XL) 200 MG 24 hr tablet Take 200 mg by mouth daily.   Yes Historical Provider, MD  pantoprazole (PROTONIX) 40 MG tablet Take 40 mg by mouth daily.   Yes Historical Provider, MD  potassium chloride SA (K-DUR,KLOR-CON) 20 MEQ tablet Take 40 mEq by mouth 2 (two) times daily.   Yes Historical Provider, MD  pregabalin (LYRICA) 100 MG capsule Take 100 mg by mouth 2 (two) times daily as needed (pt takes 1 capsule at bedtime and may take 1 capsule after luinch if needed).   Yes Historical Provider, MD  rOPINIRole (REQUIP) 1 MG tablet Take 1 mg by mouth every morning. Total of 4mg = take with 3mg  tablet   Yes Historical  Provider, MD  rOPINIRole (REQUIP) 3 MG tablet Take 3 mg by mouth every morning. Total of 4mg = take with 3mg  tablet   Yes Historical Provider, MD  silver sulfADIAZINE (SILVADENE) 1 % cream Apply 1 application topically daily.   Yes Historical Provider, MD  simvastatin (ZOCOR) 5 MG tablet Take 5 mg by mouth at bedtime.   Yes Historical Provider, MD  spironolactone (ALDACTONE) 100 MG tablet Take 100 mg by mouth daily.   Yes Historical Provider, MD   Physical Exam: Filed Vitals:   06/23/13 1430  BP: 125/53  Pulse: 68  Temp:   Resp: 18   BP 127/57  Pulse 68  Temp(Src) 98.2 F (36.8 C) (Oral)  Resp 20  Ht 5\' 4"  (1.626 m)  Wt 88.5 kg (195 lb 1.7  oz)  BMI 33.47 kg/m2  SpO2 94%  General Appearance:    Alert, cooperative, no distress, appears stated age. Obese. nontoxic  Head:    Normocephalic, without obvious abnormality, atraumatic  Eyes:    PERRL, conjunctiva/corneas clear, EOM's intact, fundi    benign, both eyes     Nose:   Nares normal, septum midline, mucosa normal, no drainage    or sinus tenderness  Throat:   Lips, mucosa, and tongue normal; teeth and gums normal  Neck:   Supple, symmetrical, trachea midline, no adenopathy;    thyroid:  no enlargement/tenderness/nodules; no carotid   bruit or JVD  Back:     Symmetric, no curvature, ROM normal, no CVA tenderness  Lungs:     Clear to auscultation bilaterally, respirations unlabored  Chest Wall:    No tenderness or deformity   Heart:    Regular rate and rhythm, S1 and S2 normal, no murmur, rub   or gallop     Abdomen:     Soft, non-tender, bowel sounds active all four quadrants,    no masses, no organomegaly  Genitalia:    deferred  Rectal:    deferred  Extremities:   Right foot with erythema dorsum and plantar surface. Extensive blistering with purulent drainage and fluctuance at 1st MTP medially.  No odor.  Dark discoloration. Pulses strong. Ulceration on plantar surface clean  Pulses:   2+ and symmetric all extremities  Skin:   Skin color, texture, turgor normal, no rashes or lesions  Lymph nodes:   Cervical, supraclavicular, and axillary nodes normal  Neurologic:   CNII-XII intact, normal strength, sensation and reflexes    throughout    Psychiatric: normal affect  Labs on Admission:  Basic Metabolic Panel:  Recent Labs Lab 06/23/13 1241  NA 135  K 4.3  CL 98  CO2 27  GLUCOSE 143*  BUN 13  CREATININE 1.05  CALCIUM 9.4   Liver Function Tests: No results found for this basename: AST, ALT, ALKPHOS, BILITOT, PROT, ALBUMIN,  in the last 168 hours No results found for this basename: LIPASE, AMYLASE,  in the last 168 hours No results found for this  basename: AMMONIA,  in the last 168 hours CBC:  Recent Labs Lab 06/23/13 1241  WBC 14.0*  NEUTROABS 10.9*  HGB 10.9*  HCT 33.7*  MCV 95.5  PLT 241   Cardiac Enzymes: No results found for this basename: CKTOTAL, CKMB, CKMBINDEX, TROPONINI,  in the last 168 hours  BNP (last 3 results) No results found for this basename: PROBNP,  in the last 8760 hours CBG: No results found for this basename: GLUCAP,  in the last 168 hours  Radiological Exams on Admission: Dg Foot  Complete Right  06/23/2013   CLINICAL DATA:  Nonhealing wound 1st metatarsal phalangeal joint  EXAM: RIGHT FOOT COMPLETE - 3+ VIEW  COMPARISON:  01/12/2007  FINDINGS: Three views of the right foot submitted. 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 There is diffuse osteopenia. Hallux valgus 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 2nd and 4th metatarsal phalangeal joint. There is plantar spurring of calcaneus. Dorsal spurring tarsal region. Significant soft tissue swelling great toe and medial to 1st metatarsal. There is soft tissue irregularity probable wound in this region. No definite bone destruction or erosion to suggest osteomyelitis. Stable postsurgical changes in the base of 1st proximal phalanx. Stable old fracture deformity 5th metatarsal.  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 There is diffuse osteopenia. Hallux valgus 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 2nd and 4th metatarsal phalangeal joint. There is plantar spurring of calcaneus. Dorsal spurring tarsal region. Significant soft tissue swelling great toe and medial to 1st metatarsal. There is soft tissue irregularity probable wound in this region. No definite bone destruction or erosion to suggest osteomyelitis. Stable postsurgical changes in the base of 1st proximal phalanx.   Electronically Signed   By: Natasha Mead M.D.   On: 06/23/2013 12:33    Assessment/Plan Principal Problem:   Diabetic  infection of right foot: pcn allergic.  Vanc, imipenum. Culture drainage.  CRP, MRI to evaluate extent of abscess and possible osteomyelitis.  Needs surgical debridement.  Dr. Ralene Cork to evaluate in am. Active Problems:   Insulin dependent diabetes mellitus: cont bid insulin mix. Check hgb a1c   Essential hypertension, benign   Diabetic neuropathy   Unspecified hypothyroidism   Anemia   Other and unspecified hyperlipidemia   Chronic CHF, compensated. Continue home meds.  EF not known   GERD (gastroesophageal reflux disease)  Code Status: *full Family Communication: *none Disposition Plan: home  Time spent: 60 min  Kashius Dominic L [facility] Pager [phone]  If 7PM-7AM, please contact night-coverage [email or web address] Password [facility] 06/23/2013, 3:46 PM

## 2013-06-23 NOTE — ED Notes (Signed)
Patient transported to X-ray 

## 2013-06-23 NOTE — ED Provider Notes (Signed)
CSN: [id number]     Arrival date & time 06/23/13  1055 History   First MD Initiated Contact with Patient 06/23/13 1156     Chief Complaint  Patient presents with  . Foot Pain   (Consider location/radiation/quality/duration/timing/severity/associated sxs/prior Treatment) HPI Comments: Patient has a history of chronic right-sided foot ulcer intermittently on antibiotics who presents today with a one-day history of worsening area of wound on the right foot and redness and warmth going up the leg. Also for the last 24 hours she has had chills, and no appetite and a generalized feeling of malaise.  Patient was last on doxycycline approximately one week ago for a right foot infection. She has been seeing [facility] and states in the future they were going to do a graft to a chronic nonhealing ulcer to the bottom of her right foot. Her pain is intermittent and mild due to diabetic neuropathy.  The history is provided by the patient.    Past Medical History  Diagnosis Date  . Diabetes mellitus without complication   . Allergy   . Arthritis   . Asthma   . Blood transfusion without reported diagnosis   . Cancer   . Cataract   . Clotting disorder   . CHF (congestive heart failure)   . GERD (gastroesophageal reflux disease)   . Hyperlipidemia   . Hypertension   . Neuromuscular disorder   . Osteoporosis   . Stroke   . Thyroid disease   . Ulcer    Past Surgical History  Procedure Laterality Date  . Knee surgery      bi-lateral   History reviewed. No pertinent family history. History  Substance Use Topics  . Smoking status: Never Smoker   . Smokeless tobacco: Never Used  . Alcohol Use: No   OB History   Grav Para Term Preterm Abortions TAB SAB Ect Mult Living                 Review of Systems  Constitutional: Positive for fever, chills and appetite change.  HENT: Negative for congestion, rhinorrhea and trouble swallowing.   Respiratory: Positive for cough.        Chronic cough  and exertional dyspnea due to asthma and pulmonary fibrosis. She denies any new upper respiratory symptoms such as congestion or productive cough. She is currently not short of breath  Gastrointestinal: Negative for nausea, vomiting and abdominal pain.  Genitourinary: Negative for dysuria.  All other systems reviewed and are negative.    Allergies  Iodine; Penicillins; Shrimp; Tape; and Codeine  Home Medications   Current Outpatient Rx  Name  Route  Sig  Dispense  Refill  . acetaminophen (TYLENOL) 500 MG tablet   Oral   Take 500-1,000 mg by mouth 2 (two) times daily after a meal. Take 2 tablets a bedtime and 1 during the day if needed         . dipyridamole-aspirin (AGGRENOX) 200-25 MG per 12 hr capsule   Oral   Take 1 capsule by mouth 2 (two) times daily.         . furosemide (LASIX) 80 MG tablet   Oral   Take 80 mg by mouth daily.          Marland Kitchen gabapentin (NEURONTIN) 600 MG tablet   Oral   Take 600 mg by mouth at bedtime.          . insulin lispro protamine-lispro (HUMALOG 75/25) (75-25) 100 UNIT/ML SUSP injection   Subcutaneous  Inject 60 Units into the skin 2 (two) times daily with a meal.         . ipratropium (ATROVENT) 0.06 % nasal spray   Nasal   Place 2 sprays into the nose 2 (two) times daily.         Marland Kitchen levothyroxine (SYNTHROID, LEVOTHROID) 75 MCG tablet   Oral   Take 75 mcg by mouth daily before breakfast.         . metoprolol (TOPROL-XL) 200 MG 24 hr tablet   Oral   Take 200 mg by mouth daily.         . pantoprazole (PROTONIX) 40 MG tablet   Oral   Take 40 mg by mouth daily.         . potassium chloride SA (K-DUR,KLOR-CON) 20 MEQ tablet   Oral   Take 40 mEq by mouth 2 (two) times daily.         . pregabalin (LYRICA) 100 MG capsule   Oral   Take 100 mg by mouth 2 (two) times daily as needed (pt takes 1 capsule at bedtime and may take 1 capsule after luinch if needed).         Marland Kitchen rOPINIRole (REQUIP) 1 MG tablet   Oral   Take 1  mg by mouth every morning. Total of 4mg = take with 3mg  tablet         . rOPINIRole (REQUIP) 3 MG tablet   Oral   Take 3 mg by mouth every morning. Total of 4mg = take with 3mg  tablet         . silver sulfADIAZINE (SILVADENE) 1 % cream   Topical   Apply 1 application topically daily.         . simvastatin (ZOCOR) 5 MG tablet   Oral   Take 5 mg by mouth at bedtime.         Marland Kitchen spironolactone (ALDACTONE) 100 MG tablet   Oral   Take 100 mg by mouth daily.          BP 119/44  Pulse 76  Temp(Src) 101.3 F (38.5 C) (Oral)  Resp 18  SpO2 95% Physical Exam  Nursing note and vitals reviewed. Constitutional: She is oriented to person, place, and time. She appears well-developed and well-nourished. No distress.  HENT:  Head: Normocephalic and atraumatic.  Mouth/Throat: Oropharynx is clear and moist.  Eyes: Conjunctivae and EOM are normal. Pupils are equal, round, and reactive to light.  Neck: Normal range of motion. Neck supple.  Cardiovascular: Normal rate, regular rhythm and intact distal pulses.   Murmur heard.  Systolic murmur is present with a grade of 2/6  Pulmonary/Chest: Effort normal. No respiratory distress. She has no wheezes. She has rales.  Fine crackles throughout lung fields  Abdominal: Soft. She exhibits no distension. There is no tenderness. There is no rebound and no guarding.  Musculoskeletal: Normal range of motion. She exhibits tenderness. She exhibits no edema.       Feet:  Neurological: She is alert and oriented to person, place, and time.  Skin: Skin is warm and dry. No rash noted. No erythema.  Psychiatric: She has a normal mood and affect. Her behavior is normal.    ED Course  Procedures (including critical care time) Labs Review Labs Reviewed  CBC WITH DIFFERENTIAL - Abnormal; Notable for the following:    WBC 14.0 (*)    RBC 3.53 (*)    Hemoglobin 10.9 (*)    HCT 33.7 (*)  Neutrophils Relative % 78 (*)    Neutro Abs 10.9 (*)     Lymphocytes Relative 11 (*)    Monocytes Absolute 1.5 (*)    All other components within normal limits  BASIC METABOLIC PANEL - Abnormal; Notable for the following:    Glucose, Bld 143 (*)    GFR calc non Af Amer 51 (*)    GFR calc Af Amer 60 (*)    All other components within normal limits   Imaging Review Dg Foot Complete Right  06/23/2013   CLINICAL DATA:  Nonhealing wound 1st metatarsal phalangeal joint  EXAM: RIGHT FOOT COMPLETE - 3+ VIEW  COMPARISON:  01/12/2007  FINDINGS: Three views of the right foot submitted. 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 There is diffuse osteopenia. Hallux valgus 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 2nd and 4th metatarsal phalangeal joint. There is plantar spurring of calcaneus. Dorsal spurring tarsal region. Significant soft tissue swelling great toe and medial to 1st metatarsal. There is soft tissue irregularity probable wound in this region. No definite bone destruction or erosion to suggest osteomyelitis. Stable postsurgical changes in the base of 1st proximal phalanx. Stable old fracture deformity 5th metatarsal.  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 There is diffuse osteopenia. Hallux valgus 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 2nd and 4th metatarsal phalangeal joint. There is plantar spurring of calcaneus. Dorsal spurring tarsal region. Significant soft tissue swelling great toe and medial to 1st metatarsal. There is soft tissue irregularity probable wound in this region. No definite bone destruction or erosion to suggest osteomyelitis. Stable postsurgical changes in the base of 1st proximal phalanx.   Electronically Signed   By: Natasha Mead M.D.   On: 06/23/2013 12:33    EKG Interpretation   None       MDM   1. Cellulitis   2. Diabetic foot ulcer     Patient with evidence of a diabetic foot ulcer and now with cellulitis going right leg. She also states wound is worse  than what it has been. Patient has been suffering from a wound for the last several months and completed her last course of doxycycline approximately one week ago. Patient has evidence of a draining ulcer to the right great toe as well as warmth and redness going up to the knee. She is febrile to 101 with a white count of 14,000. X-ray shows no sign of definitive osteomyelitis. Patient started on vancomycin and will admit for further care    Gwyneth Sprout, MD 06/23/13 1406

## 2013-06-24 ENCOUNTER — Encounter (HOSPITAL_COMMUNITY): Payer: Medicare Other | Admitting: *Deleted

## 2013-06-24 ENCOUNTER — Inpatient Hospital Stay (HOSPITAL_COMMUNITY): Payer: Medicare Other | Admitting: *Deleted

## 2013-06-24 ENCOUNTER — Encounter (HOSPITAL_COMMUNITY): Payer: Self-pay | Admitting: *Deleted

## 2013-06-24 ENCOUNTER — Encounter (HOSPITAL_COMMUNITY)
Admission: EM | Disposition: A | Discharge status: Home Health | Payer: Self-pay | Source: Home / Self Care | Attending: Internal Medicine

## 2013-06-24 ENCOUNTER — Inpatient Hospital Stay (HOSPITAL_COMMUNITY): Payer: Medicare Other

## 2013-06-24 DIAGNOSIS — L0291 Cutaneous abscess, unspecified: Secondary | ICD-10-CM

## 2013-06-24 DIAGNOSIS — M86179 Other acute osteomyelitis, unspecified ankle and foot: Secondary | ICD-10-CM

## 2013-06-24 DIAGNOSIS — I5032 Chronic diastolic (congestive) heart failure: Secondary | ICD-10-CM

## 2013-06-24 DIAGNOSIS — L97509 Non-pressure chronic ulcer of other part of unspecified foot with unspecified severity: Secondary | ICD-10-CM

## 2013-06-24 HISTORY — PX: I&D EXTREMITY: SHX5045

## 2013-06-24 LAB — GLUCOSE, CAPILLARY: Glucose-Capillary: 77 mg/dL (ref 70–99)

## 2013-06-24 LAB — CLOSTRIDIUM DIFFICILE BY PCR: Toxigenic C. Difficile by PCR: NEGATIVE

## 2013-06-24 SURGERY — IRRIGATION AND DEBRIDEMENT EXTREMITY
Anesthesia: General | Site: Foot | Laterality: Right | Wound class: Dirty or Infected

## 2013-06-24 MED ORDER — ONDANSETRON HCL 4 MG/2ML IJ SOLN: 4.0000 mg | Freq: Once | INTRAMUSCULAR | Status: AC | PRN

## 2013-06-24 MED ORDER — PROPOFOL 10 MG/ML IV BOLUS
INTRAVENOUS | Status: DC | PRN
  Administered 2013-06-24: 90 mg via INTRAVENOUS

## 2013-06-24 MED ORDER — HYDROCODONE-ACETAMINOPHEN 10-325 MG PO TABS
1.0000 | ORAL_TABLET | Freq: Four times a day (QID) | ORAL | Status: DC | PRN
  Administered 2013-06-25 (×2): 1 via ORAL
  Filled 2013-06-24 (×2): qty 1

## 2013-06-24 MED ORDER — FENTANYL CITRATE 0.05 MG/ML IJ SOLN
INTRAMUSCULAR | Status: DC | PRN
  Administered 2013-06-24 (×2): 50 ug via INTRAVENOUS

## 2013-06-24 MED ORDER — FENTANYL CITRATE 0.05 MG/ML IJ SOLN
INTRAMUSCULAR | Status: AC
Start: 2013-06-24 — End: 2013-06-25
  Filled 2013-06-24: qty 2

## 2013-06-24 MED ORDER — LACTATED RINGERS IV SOLN
INTRAVENOUS | Status: DC | PRN
  Administered 2013-06-24: 19:00:00 via INTRAVENOUS

## 2013-06-24 MED ORDER — ONDANSETRON HCL 4 MG/2ML IJ SOLN
INTRAMUSCULAR | Status: DC | PRN
  Administered 2013-06-24: 4 mg via INTRAVENOUS

## 2013-06-24 MED ORDER — OXYCODONE HCL 5 MG PO TABS
ORAL_TABLET | ORAL | Status: AC
  Filled 2013-06-24: qty 1

## 2013-06-24 MED ORDER — FENTANYL CITRATE 0.05 MG/ML IJ SOLN
25.0000 ug | INTRAMUSCULAR | Status: DC | PRN
  Administered 2013-06-24: 50 ug via INTRAVENOUS
  Administered 2013-06-24: 25 ug via INTRAVENOUS

## 2013-06-24 MED ORDER — SODIUM CHLORIDE 0.9 % IR SOLN
Status: DC | PRN
  Administered 2013-06-24: 3000 mL

## 2013-06-24 MED ORDER — LIDOCAINE HCL (CARDIAC) 20 MG/ML IV SOLN
INTRAVENOUS | Status: DC | PRN
  Administered 2013-06-24: 50 mg via INTRAVENOUS

## 2013-06-24 SURGICAL SUPPLY — 41 items
BANDAGE CONFORM 2  STR LF (GAUZE/BANDAGES/DRESSINGS) ×2 IMPLANT
BANDAGE ELASTIC 3 VELCRO ST LF (GAUZE/BANDAGES/DRESSINGS) ×2 IMPLANT
BANDAGE ESMARK 6X9 LF (GAUZE/BANDAGES/DRESSINGS) IMPLANT
BANDAGE GAUZE ELAST BULKY 4 IN (GAUZE/BANDAGES/DRESSINGS) ×1 IMPLANT
BLADE AVERAGE 25X9 (BLADE) ×2 IMPLANT
BLADE SURG 15 STRL LF DISP TIS (BLADE) IMPLANT
BLADE SURG 15 STRL SS (BLADE) ×2
BNDG CMPR 9X4 STRL LF SNTH (GAUZE/BANDAGES/DRESSINGS) ×1
BNDG CMPR 9X6 STRL LF SNTH (GAUZE/BANDAGES/DRESSINGS) ×1
BNDG ESMARK 4X9 LF (GAUZE/BANDAGES/DRESSINGS) ×2 IMPLANT
BNDG ESMARK 6X9 LF (GAUZE/BANDAGES/DRESSINGS) ×2
CLOTH BEACON ORANGE TIMEOUT ST (SAFETY) ×2 IMPLANT
COVER SURGICAL LIGHT HANDLE (MISCELLANEOUS) ×2 IMPLANT
CUFF TOURNIQUET SINGLE 24IN (TOURNIQUET CUFF) IMPLANT
CUFF TOURNIQUET SINGLE 34IN LL (TOURNIQUET CUFF) ×1 IMPLANT
DURAPREP 26ML APPLICATOR (WOUND CARE) IMPLANT
ELECT REM PT RETURN 9FT ADLT (ELECTROSURGICAL) ×2
ELECTRODE REM PT RTRN 9FT ADLT (ELECTROSURGICAL) ×1 IMPLANT
GLOVE BIO SURGEON STRL SZ8 (GLOVE) IMPLANT
GLOVE EXAM NITRILE LRG STRL (GLOVE) ×1 IMPLANT
GLOVE SURG SS PI 6.5 STRL IVOR (GLOVE) ×1 IMPLANT
GLOVE SURG SS PI 8.0 STRL IVOR (GLOVE) ×2 IMPLANT
GOWN PREVENTION PLUS XLARGE (GOWN DISPOSABLE) ×2 IMPLANT
GOWN STRL NON-REIN LRG LVL3 (GOWN DISPOSABLE) ×2 IMPLANT
HANDPIECE INTERPULSE COAX TIP (DISPOSABLE) ×2
KIT BASIN OR (CUSTOM PROCEDURE TRAY) ×1 IMPLANT
KIT ROOM TURNOVER OR (KITS) ×2 IMPLANT
NDL HYPO 25X1 1.5 SAFETY (NEEDLE) ×2 IMPLANT
NEEDLE 18GX1X1/2 (RX/OR ONLY) (NEEDLE) IMPLANT
NEEDLE HYPO 25X1 1.5 SAFETY (NEEDLE) IMPLANT
NS IRRIG 1000ML POUR BTL (IV SOLUTION) ×1 IMPLANT
PACK ORTHO EXTREMITY (CUSTOM PROCEDURE TRAY) ×2 IMPLANT
PAD ARMBOARD 7.5X6 YLW CONV (MISCELLANEOUS) ×4 IMPLANT
SET HNDPC FAN SPRY TIP SCT (DISPOSABLE) ×1 IMPLANT
SPONGE GAUZE 4X4 12PLY (GAUZE/BANDAGES/DRESSINGS) ×2 IMPLANT
SUT ETHILON 3 0 PS 1 (SUTURE) ×4 IMPLANT
SUT VIC AB 3-0 FS2 27 (SUTURE) ×2 IMPLANT
SUT VICRYL 4-0 PS2 18IN ABS (SUTURE) IMPLANT
SYR CONTROL 10ML LL (SYRINGE) ×2 IMPLANT
TUBE ANAEROBIC SPECIMEN COL (MISCELLANEOUS) ×3 IMPLANT
WATER STERILE IRR 1000ML POUR (IV SOLUTION) IMPLANT

## 2013-06-24 NOTE — Progress Notes (Signed)
Utilization review completed.  

## 2013-06-24 NOTE — Progress Notes (Signed)
Pt returned to floor from PACU. Pt VSS, in NAD. Pt visiting with family. Will continue to monitor. Baron Hamper, RN

## 2013-06-24 NOTE — Anesthesia Postprocedure Evaluation (Signed)
  Anesthesia Post-op Note  Patient: Stephanie Brock  Procedure(s) Performed: Procedure(s): IRRIGATION AND DEBRIDEMENT, partial resection of right great toe and closure (Right)  Patient Location: PACU  Anesthesia Type:General  Level of Consciousness: awake, alert  and oriented  Airway and Oxygen Therapy: Patient Spontanous Breathing  Post-op Pain: mild  Post-op Assessment: Post-op Vital signs reviewed  Post-op Vital Signs: Reviewed  Complications: No apparent anesthesia complications

## 2013-06-24 NOTE — Progress Notes (Signed)
Pt. Showed no signs of distress when taken to surgery for debridement of right foot ulcer.

## 2013-06-24 NOTE — Plan of Care (Signed)
Problem: Phase I Progression Outcomes Goal: Pain controlled with appropriate interventions Outcome: Completed/Met Date Met:  06/24/13 Pt with no complaints of pain Goal: OOB as tolerated unless otherwise ordered Outcome: Progressing Pt with NWB restrictions to right lower extremity. Pt OOB to [facility] with 1 assist, pt refuses NWB restriction

## 2013-06-24 NOTE — Consult Note (Signed)
Reason for Consult:Ulcer and cellulitis right great toe joint. Referring Physician:sullivan    Stephanie Brock is an 72 y.o. female.  HPI: severla year history mof [lantar foot ulcer, but in lact 2 days has spread to top of foot and drained pus   Past Medical History  Diagnosis Date  . Diabetes mellitus without complication   . Allergy   . Arthritis   . Asthma   . Blood transfusion without reported diagnosis   . Cancer   . Cataract   . Clotting disorder   . CHF (congestive heart failure)   . GERD (gastroesophageal reflux disease)   . Hyperlipidemia   . Hypertension   . Neuromuscular disorder   . Osteoporosis   . Stroke   . Thyroid disease   . Ulcer     Past Surgical History  Procedure Laterality Date  . Knee surgery      bi-lateral    History reviewed. No pertinent family history.  Social History:  reports that she has never smoked. She has never used smokeless tobacco. She reports that she does not drink alcohol or use illicit drugs.  Allergies:  Allergies  Allergen Reactions  . Iodine   . Penicillins     whelps  . Shrimp [Shellfish Allergy]     Whelps   . Tape   . Codeine Rash    Medications: I have reviewed the patient's current medications.  Results for orders placed during the hospital encounter of 06/23/13 (from the past 48 hour(s))  CBC WITH DIFFERENTIAL     Status: Abnormal   Collection Time    06/23/13 12:41 PM      Result Value Range   WBC 14.0 (*) 4.0 - 10.5 K/uL   RBC 3.53 (*) 3.87 - 5.11 MIL/uL   Hemoglobin 10.9 (*) 12.0 - 15.0 g/dL   HCT 40.9 (*) 81.1 - 91.4 %   MCV 95.5  78.0 - 100.0 fL   MCH 30.9  26.0 - 34.0 pg   MCHC 32.3  30.0 - 36.0 g/dL   RDW 78.2  95.6 - 21.3 %   Platelets 241  150 - 400 K/uL   Neutrophils Relative % 78 (*) 43 - 77 %   Neutro Abs 10.9 (*) 1.7 - 7.7 K/uL   Lymphocytes Relative 11 (*) 12 - 46 %   Lymphs Abs 1.6  0.7 - 4.0 K/uL   Monocytes Relative 11  3 - 12 %   Monocytes Absolute 1.5 (*) 0.1 - 1.0 K/uL   Eosinophils Relative 1  0 - 5 %   Eosinophils Absolute 0.1  0.0 - 0.7 K/uL   Basophils Relative 0  0 - 1 %   Basophils Absolute 0.0  0.0 - 0.1 K/uL  BASIC METABOLIC PANEL     Status: Abnormal   Collection Time    06/23/13 12:41 PM      Result Value Range   Sodium 135  135 - 145 mEq/L   Potassium 4.3  3.5 - 5.1 mEq/L   Chloride 98  96 - 112 mEq/L   CO2 27  19 - 32 mEq/L   Glucose, Bld 143 (*) 70 - 99 mg/dL   BUN 13  6 - 23 mg/dL   Creatinine, Ser 0.86  0.50 - 1.10 mg/dL   Calcium 9.4  8.4 - 57.8 mg/dL   GFR calc non Af Amer 51 (*) >90 mL/min   GFR calc Af Amer 60 (*) >90 mL/min   Comment: (NOTE)     The  eGFR has been calculated using the CKD EPI equation.     This calculation has not been validated in all clinical situations.     eGFR's persistently <90 mL/min signify possible Chronic Kidney     Disease.  WOUND CULTURE     Status: None   Collection Time    06/23/13  3:51 PM      Result Value Range   Specimen Description WOUND FOOT RIGHT     Special Requests NONE     Gram Stain PENDING     Culture       Value: Culture reincubated for better growth     Performed at [facility]   Report Status PENDING    HEMOGLOBIN A1C     Status: Abnormal   Collection Time    06/23/13  5:00 PM      Result Value Range   Hemoglobin A1C 8.0 (*) <5.7 %   Comment: (NOTE)                                                                               According to the ADA Clinical Practice Recommendations for 2011, when     HbA1c is used as a screening test:      >=6.5%   Diagnostic of Diabetes Mellitus               (if abnormal result is confirmed)     5.7-6.4%   Increased risk of developing Diabetes Mellitus     References:Diagnosis and Classification of Diabetes Mellitus,Diabetes     Care,2011,34(Suppl 1):S62-S69 and Standards of Medical Care in             Diabetes - 2011,Diabetes Care,2011,34 (Suppl 1):S11-S61.   Mean Plasma Glucose 183 (*) <117 mg/dL   Comment: Performed at  [facility]  C-REACTIVE PROTEIN     Status: Abnormal   Collection Time    06/23/13  5:00 PM      Result Value Range   CRP 29.8 (*) <0.60 mg/dL   Comment: Performed at [facility]  GLUCOSE, CAPILLARY     Status: None   Collection Time    06/24/13  6:28 AM      Result Value Range   Glucose-Capillary 82  70 - 99 mg/dL  GLUCOSE, CAPILLARY     Status: None   Collection Time    06/24/13  7:48 AM      Result Value Range   Glucose-Capillary 83  70 - 99 mg/dL    Dg Foot Complete Right  06/23/2013   CLINICAL DATA:  Nonhealing wound 1st metatarsal phalangeal joint  EXAM: RIGHT FOOT COMPLETE - 3+ VIEW  COMPARISON:  01/12/2007  FINDINGS: Three views of the right foot submitted. 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 There is diffuse osteopenia. Hallux valgus 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 2nd and 4th metatarsal phalangeal joint. There is plantar spurring of calcaneus. Dorsal spurring tarsal region. Significant soft tissue swelling great toe and medial to 1st metatarsal. There is soft tissue irregularity probable wound in this region. No definite bone destruction or erosion to suggest osteomyelitis. Stable postsurgical changes in the base of 1st proximal phalanx. Stable old  fracture deformity 5th metatarsal.  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 There is diffuse osteopenia. Hallux valgus 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 2nd and 4th metatarsal phalangeal joint. There is plantar spurring of calcaneus. Dorsal spurring tarsal region. Significant soft tissue swelling great toe and medial to 1st metatarsal. There is soft tissue irregularity probable wound in this region. No definite bone destruction or erosion to suggest osteomyelitis. Stable postsurgical changes in the base of 1st proximal phalanx.   Electronically Signed   By: Natasha Mead M.D.   On: 06/23/2013 12:33    Review of Systems  Constitutional:  Positive for fever and chills.  HENT: Negative.   Eyes: Negative.   Respiratory: Negative.   Cardiovascular: Positive for leg swelling.  Gastrointestinal: Negative.   Neurological: Positive for sensory change.  Psychiatric/Behavioral: Negative.    Blood pressure 125/47, pulse 59, temperature 97.7 F (36.5 C), temperature source Oral, resp. rate 20, height 5\' 4"  (1.626 m), weight 89.313 kg (196 lb 14.4 oz), SpO2 97.00%. Physical Exam  Constitutional: She is oriented to person, place, and time. She appears well-developed and well-nourished.  HENT:  Head: Normocephalic.  Cardiovascular: Normal rate, regular rhythm and intact distal pulses.   Pulses:      Dorsalis pedis pulses are 2+ on the right side, and 1+ on the left side.       Posterior tibial pulses are 1+ on the right side, and 1+ on the left side.  +1 edema on right, mild calor.  Respiratory: Effort normal.  Musculoskeletal:       Left foot: She exhibits decreased range of motion and deformity.       Feet:  Blister/ ulcer of 1st mtp area right foot  3x5cm. Down to sub cutaneus tissue.  Draining.  Neurological: She is alert and oriented to person, place, and time.  Skin: Skin is warm and dry. No cyanosis. Nails show no clubbing.  Ulcer rt foot  Psychiatric: She has a normal mood and affect. Her behavior is normal.    Assessment/Plan: Ulcer with cellulitis, right foot. Diabetes with neuropathy  Plan for consent to surgery for debridement of foot ulcer and possible resection of infected or involved bone.later this afternoon/evening.  Flint Hakeem 06/24/2013, 8:07 AM

## 2013-06-24 NOTE — Brief Op Note (Signed)
06/23/2013 - 06/24/2013  8:17 PM  PATIENT:  Elson Clan  72 y.o. female  PRE-OPERATIVE DIAGNOSIS:  Right FOOT ABSCESS  POST-OPERATIVE DIAGNOSIS:  Right FOOT ABSCESS with extension to the first MTP joint PROCEDURE:  Procedure(s): IRRIGATION AND DEBRIDEMENT, partial resection of right great toe and closure (Right)  SURGEON:  Surgeon(s) and Role:    * Alvan Dame, DPM - Primary  PHYSICIAN ASSISTANT:   ASSISTANTS: none   ANESTHESIA:   general  EBL:  Total I/O In: 300 [I.V.:300] Out: 10 [Blood:10]  BLOOD ADMINISTERED:none  DRAINS: none   LOCAL MEDICATIONS USED:  NONE  SPECIMEN:  Excision  DISPOSITION OF SPECIMEN:  PATHOLOGY  COUNTS:  YES  TOURNIQUET:  * Missing tourniquet times found for documented tourniquets in log:  [id number] *  DICTATION: .Dragon Dictation  PLAN OF CARE: Admit to inpatient   PATIENT DISPOSITION:  PACU - hemodynamically stable.   Delay start of Pharmacological VTE agent (>24hrs) due to surgical blood loss or risk of bleeding: yes

## 2013-06-24 NOTE — Progress Notes (Signed)
[facility] PROGRESS NOTE Assessment/Plan: *Diabetic infection of right foot/  Diabetic neuropathy: - Vanc imipenem 10.20.2014, MRI pending, foot x-ray: Significant soft tissue swelling great toe and medial  to 1st metatarsal. There is soft tissue irregularity probable wound in this region. No definite bone destruction or erosion. - has remained afebrile. For I&D 10.21.2014.  Insulin dependent diabetes mellitus - cont insulin 70/30. CBG"s well controlled.   Essential hypertension, benign - controlled, monitor.  Chronic in  Nature Anemia: - MCV 95. Will need further follow up as an outpatient.  Chronic diastolic HF - Compensated. - Cont lasix, metoprolol, spironolactone.   GERD (gastroesophageal reflux disease) - PPI.  Code Status: *full  Family Communication: *none  Disposition Plan: home    Consultants:  ortho  Procedures:  MRI  Foot x-ray  Antibiotics:  vanc and imipenem 10.21.2014  HPI/Subjective: No pain, ready for surgery  Objective: Filed Vitals:   06/23/13 1430 06/23/13 1530 06/23/13 2036 06/24/13 0531  BP: 125/53 127/57 105/54 125/47  Pulse: 68 68 77 59  Temp:  98.2 F (36.8 C) 98.5 F (36.9 C) 97.7 F (36.5 C)  TempSrc:  Oral Oral Oral  Resp: 18 20 20 20   Height:  5\' 4"  (1.626 m)    Weight:  88.5 kg (195 lb 1.7 oz)  89.313 kg (196 lb 14.4 oz)  SpO2: 96% 94% 96% 97%    Intake/Output Summary (Last 24 hours) at 06/24/13 0817 Last data filed at 06/23/13 2127  Gross per 24 hour  Intake    360 ml  Output    600 ml  Net   -240 ml   Filed Weights   06/23/13 1530 06/24/13 0531  Weight: 88.5 kg (195 lb 1.7 oz) 89.313 kg (196 lb 14.4 oz)    Exam:  General: Alert, awake, oriented x3, in no acute distress.  HEENT: No bruits, no goiter.  Heart: Regular rate and rhythm, without murmurs, rubs, gallops.  Lungs: Good air movement, clear to auscultation.  Abdomen: Soft, nontender, nondistended, positive bowel sounds.  Neuro: Grossly  intact, nonfocal.   Data Reviewed: Basic Metabolic Panel:  Recent Labs Lab 06/23/13 1241  NA 135  K 4.3  CL 98  CO2 27  GLUCOSE 143*  BUN 13  CREATININE 1.05  CALCIUM 9.4   Liver Function Tests: No results found for this basename: AST, ALT, ALKPHOS, BILITOT, PROT, ALBUMIN,  in the last 168 hours No results found for this basename: LIPASE, AMYLASE,  in the last 168 hours No results found for this basename: AMMONIA,  in the last 168 hours CBC:  Recent Labs Lab 06/23/13 1241  WBC 14.0*  NEUTROABS 10.9*  HGB 10.9*  HCT 33.7*  MCV 95.5  PLT 241   Cardiac Enzymes: No results found for this basename: CKTOTAL, CKMB, CKMBINDEX, TROPONINI,  in the last 168 hours BNP (last 3 results) No results found for this basename: PROBNP,  in the last 8760 hours CBG:  Recent Labs Lab 06/24/13 0628 06/24/13 0748  GLUCAP 82 83    Recent Results (from the past 240 hour(s))  WOUND CULTURE     Status: None   Collection Time    06/23/13  3:51 PM      Result Value Range Status   Specimen Description WOUND FOOT RIGHT   Final   Special Requests NONE   Final   Gram Stain PENDING   Incomplete   Culture     Final   Value: Culture reincubated for better growth  Performed at [facility]   Report Status PENDING   Incomplete     Studies: Dg Foot Complete Right  Jul 04, 2013   CLINICAL DATA:  Nonhealing wound 1st metatarsal phalangeal joint  EXAM: RIGHT FOOT COMPLETE - 3+ VIEW  COMPARISON:  01/12/2007  FINDINGS: Three views of the right foot submitted. 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 There is diffuse osteopenia. Hallux valgus 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 2nd and 4th metatarsal phalangeal joint. There is plantar spurring of calcaneus. Dorsal spurring tarsal region. Significant soft tissue swelling great toe and medial to 1st metatarsal. There is soft tissue irregularity probable wound in this region. No definite bone destruction or  erosion to suggest osteomyelitis. Stable postsurgical changes in the base of 1st proximal phalanx. Stable old fracture deformity 5th metatarsal.  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 There is diffuse osteopenia. Hallux valgus 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 2nd and 4th metatarsal phalangeal joint. There is plantar spurring of calcaneus. Dorsal spurring tarsal region. Significant soft tissue swelling great toe and medial to 1st metatarsal. There is soft tissue irregularity probable wound in this region. No definite bone destruction or erosion to suggest osteomyelitis. Stable postsurgical changes in the base of 1st proximal phalanx.   Electronically Signed   By: Natasha Mead M.D.   On: July 04, 2013 12:33    Scheduled Meds: . dipyridamole-aspirin  1 capsule Oral BID  . furosemide  80 mg Oral Daily  . gabapentin  600 mg Oral QHS  . heparin  5,000 Units Subcutaneous Q8H  . imipenem-cilastatin  500 mg Intravenous Q8H  . influenza vac split quadrivalent PF  0.5 mL Intramuscular Tomorrow-1000  . insulin aspart protamine- aspart  60 Units Subcutaneous BID WC  . ipratropium  2 spray Nasal BID  . levothyroxine  75 mcg Oral QAC breakfast  . metoprolol  50 mg Oral Daily  . pantoprazole  40 mg Oral Daily  . potassium chloride SA  40 mEq Oral BID  . rOPINIRole  1 mg Oral q morning - 10a  . rOPINIRole  3 mg Oral q morning - 10a  . simvastatin  5 mg Oral QHS  . spironolactone  100 mg Oral Daily  . vancomycin  1,500 mg Intravenous Q24H   Continuous Infusions:    Marinda Elk  [facility] Pager [phone]. If 8PM-8AM, please contact night-coverage at [email or web address], password [facility] 06/24/2013, 8:17 AM  LOS: 1 day

## 2013-06-24 NOTE — Preoperative (Signed)
Beta Blockers   Reason not to administer Beta Blockers:Not Applicable 

## 2013-06-24 NOTE — Progress Notes (Signed)
Co-signed for LaTisha Teasley RN/BSN for assessments, IV assessments, medication administration, I's and O's, vital signs, progress notes, and care plans. Yasmin Dibello M, RN/BSN 

## 2013-06-24 NOTE — H&P (Signed)
Stephanie Brock is an 72 y.o. female.   Chief Complaint: Ulceration and abscess first MTP area right foot HPI: Patient has several month and she several year history of recurrent ulcerations sub-right first MTP joint to within the last 2 days a blisterlike lesion has formed medial and dorsal first MTP joint with purulent discharge or drainage. Patient also developed fever and chills and presented to the ER yesterday afternoon.  Past Medical History  Diagnosis Date  . Diabetes mellitus without complication   . Allergy   . Arthritis   . Asthma   . Blood transfusion without reported diagnosis   . Cancer   . Cataract   . Clotting disorder   . CHF (congestive heart failure)   . GERD (gastroesophageal reflux disease)   . Hyperlipidemia   . Hypertension   . Neuromuscular disorder   . Osteoporosis   . Stroke   . Thyroid disease   . Ulcer     Past Surgical History  Procedure Laterality Date  . Knee surgery      bi-lateral    History reviewed. No pertinent family history. Social History:  reports that she has never smoked. She has never used smokeless tobacco. She reports that she does not drink alcohol or use illicit drugs.  Allergies:  Allergies  Allergen Reactions  . Iodine   . Penicillins     whelps  . Shrimp [Shellfish Allergy]     Whelps   . Tape   . Codeine Rash    Medications Prior to Admission  Medication Sig Dispense Refill  . acetaminophen (TYLENOL) 500 MG tablet Take 500-1,000 mg by mouth 2 (two) times daily after a meal. Take 2 tablets a bedtime and 1 during the day if needed      . dipyridamole-aspirin (AGGRENOX) 200-25 MG per 12 hr capsule Take 1 capsule by mouth 2 (two) times daily.      . furosemide (LASIX) 80 MG tablet Take 80 mg by mouth daily.       Marland Kitchen gabapentin (NEURONTIN) 600 MG tablet Take 600 mg by mouth at bedtime.       . insulin lispro protamine-lispro (HUMALOG 75/25) (75-25) 100 UNIT/ML SUSP injection Inject 60 Units into the skin 2 (two) times  daily with a meal.      . ipratropium (ATROVENT) 0.06 % nasal spray Place 2 sprays into the nose 2 (two) times daily.      Marland Kitchen levothyroxine (SYNTHROID, LEVOTHROID) 75 MCG tablet Take 75 mcg by mouth daily before breakfast.      . metoprolol (TOPROL-XL) 200 MG 24 hr tablet Take 200 mg by mouth daily.      . pantoprazole (PROTONIX) 40 MG tablet Take 40 mg by mouth daily.      . potassium chloride SA (K-DUR,KLOR-CON) 20 MEQ tablet Take 40 mEq by mouth 2 (two) times daily.      . pregabalin (LYRICA) 100 MG capsule Take 100 mg by mouth 2 (two) times daily as needed (pt takes 1 capsule at bedtime and may take 1 capsule after luinch if needed).      Marland Kitchen rOPINIRole (REQUIP) 1 MG tablet Take 1 mg by mouth every morning. Total of 4mg = take with 3mg  tablet      . rOPINIRole (REQUIP) 3 MG tablet Take 3 mg by mouth every morning. Total of 4mg = take with 3mg  tablet      . silver sulfADIAZINE (SILVADENE) 1 % cream Apply 1 application topically daily.      . simvastatin (  ZOCOR) 5 MG tablet Take 5 mg by mouth at bedtime.      Marland Kitchen spironolactone (ALDACTONE) 100 MG tablet Take 100 mg by mouth daily.        Results for orders placed during the hospital encounter of 06/23/13 (from the past 48 hour(s))  CBC WITH DIFFERENTIAL     Status: Abnormal   Collection Time    06/23/13 12:41 PM      Result Value Range   WBC 14.0 (*) 4.0 - 10.5 K/uL   RBC 3.53 (*) 3.87 - 5.11 MIL/uL   Hemoglobin 10.9 (*) 12.0 - 15.0 g/dL   HCT 16.1 (*) 09.6 - 04.5 %   MCV 95.5  78.0 - 100.0 fL   MCH 30.9  26.0 - 34.0 pg   MCHC 32.3  30.0 - 36.0 g/dL   RDW 40.9  81.1 - 91.4 %   Platelets 241  150 - 400 K/uL   Neutrophils Relative % 78 (*) 43 - 77 %   Neutro Abs 10.9 (*) 1.7 - 7.7 K/uL   Lymphocytes Relative 11 (*) 12 - 46 %   Lymphs Abs 1.6  0.7 - 4.0 K/uL   Monocytes Relative 11  3 - 12 %   Monocytes Absolute 1.5 (*) 0.1 - 1.0 K/uL   Eosinophils Relative 1  0 - 5 %   Eosinophils Absolute 0.1  0.0 - 0.7 K/uL   Basophils Relative 0  0 -  1 %   Basophils Absolute 0.0  0.0 - 0.1 K/uL  BASIC METABOLIC PANEL     Status: Abnormal   Collection Time    06/23/13 12:41 PM      Result Value Range   Sodium 135  135 - 145 mEq/L   Potassium 4.3  3.5 - 5.1 mEq/L   Chloride 98  96 - 112 mEq/L   CO2 27  19 - 32 mEq/L   Glucose, Bld 143 (*) 70 - 99 mg/dL   BUN 13  6 - 23 mg/dL   Creatinine, Ser 7.82  0.50 - 1.10 mg/dL   Calcium 9.4  8.4 - 95.6 mg/dL   GFR calc non Af Amer 51 (*) >90 mL/min   GFR calc Af Amer 60 (*) >90 mL/min   Comment: (NOTE)     The eGFR has been calculated using the CKD EPI equation.     This calculation has not been validated in all clinical situations.     eGFR's persistently <90 mL/min signify possible Chronic Kidney     Disease.  WOUND CULTURE     Status: None   Collection Time    06/23/13  3:51 PM      Result Value Range   Specimen Description WOUND FOOT RIGHT     Special Requests NONE     Gram Stain       Value: NO WBC SEEN     NO SQUAMOUS EPITHELIAL CELLS SEEN     RARE GRAM POSITIVE COCCI IN PAIRS     Performed at [facility]   Culture       Value: Culture reincubated for better growth     Performed at [facility]   Report Status PENDING    HEMOGLOBIN A1C     Status: Abnormal   Collection Time    06/23/13  5:00 PM      Result Value Range   Hemoglobin A1C 8.0 (*) <5.7 %   Comment: (NOTE)  According to the ADA Clinical Practice Recommendations for 2011, when     HbA1c is used as a screening test:      >=6.5%   Diagnostic of Diabetes Mellitus               (if abnormal result is confirmed)     5.7-6.4%   Increased risk of developing Diabetes Mellitus     References:Diagnosis and Classification of Diabetes Mellitus,Diabetes     Care,2011,34(Suppl 1):S62-S69 and Standards of Medical Care in             Diabetes - 2011,Diabetes Care,2011,34 (Suppl 1):S11-S61.   Mean Plasma Glucose 183 (*) <117 mg/dL    Comment: Performed at [facility]  C-REACTIVE PROTEIN     Status: Abnormal   Collection Time    06/23/13  5:00 PM      Result Value Range   CRP 29.8 (*) <0.60 mg/dL   Comment: Performed at [facility]  GLUCOSE, CAPILLARY     Status: None   Collection Time    06/24/13  6:28 AM      Result Value Range   Glucose-Capillary 82  70 - 99 mg/dL  GLUCOSE, CAPILLARY     Status: None   Collection Time    06/24/13  7:48 AM      Result Value Range   Glucose-Capillary 83  70 - 99 mg/dL   Mr Foot Right W Wo Contrast  06/24/2013   CLINICAL DATA:  Diabetic foot infection. Osteomyelitis. Redness and drainage with blistering. 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  EXAM: MRI OF THE RIGHT FOREFOOT WITHOUT AND WITH CONTRAST  TECHNIQUE: Multiplanar, multisequence MR imaging was performed both before and after administration of intravenous contrast.  CONTRAST:  19mL MULTIHANCE GADOBENATE DIMEGLUMINE 529 MG/ML IV SOLN  COMPARISON:  Radiographs 06/23/2013.  FINDINGS: Diffuse subcutaneous infiltration/edema is present in the forefoot. There is subcutaneous edema and post gadolinium enhancement compatible with soft tissue infection. There is a swath of necrotic subcutaneous fat and an area of ulceration medial to the distal 1st metatarsal shaft and extending to the 1st MTP joint (image 8 series 13). The necrotic tissue undermines the skin surface. This is compatible with an area of ulceration and skin necrosis. There is no abscess. Cellulitis radiates from this necrotic tissue. Despite the proximity to the 1st metatarsal head, there is no osteomyelitis. Mild reactive bone marrow edema is present in the 1st metatarsal head which appears due to combination of degenerative disease and hypervascularity nearby. Postsurgical changes of bunionectomy are present with susceptibility artifact in the medial proximal phalanx of the great toe. Old 5th metatarsal fracture is incidentally noted. Severe degenerative changes  of the midfoot are present, most compatible with neuropathic midfoot in this diabetic patient.  IMPRESSION: 1. Forefoot cellulitis centered around the medial aspect of the 1st MTP joint. 2. Ulceration over the medial 1st MTP joint with about 5 cm area of necrotic subcutaneous tissue extending up to the ulcer. No abscess. 3. Neuropathic midfoot. 4. Old posttraumatic and postoperative changes of the foot.   Electronically Signed   By: Andreas Newport M.D.   On: 06/24/2013 09:44   Dg Foot Complete Right  06/23/2013   CLINICAL DATA:  Nonhealing wound 1st metatarsal phalangeal joint  EXAM: RIGHT FOOT COMPLETE - 3+ VIEW  COMPARISON:  01/12/2007  FINDINGS: Three views of the right foot submitted. 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 There is diffuse osteopenia. Hallux valgus deformity. Degenerative changes and probable chronic subluxation  of 2nd and 4th metatarsal phalangeal joint. There is plantar spurring of calcaneus. Dorsal spurring tarsal region. Significant soft tissue swelling great toe and medial to 1st metatarsal. There is soft tissue irregularity probable wound in this region. No definite bone destruction or erosion to suggest osteomyelitis. Stable postsurgical changes in the base of 1st proximal phalanx. Stable old fracture deformity 5th metatarsal.  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 There is diffuse osteopenia. Hallux valgus 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 2nd and 4th metatarsal phalangeal joint. There is plantar spurring of calcaneus. Dorsal spurring tarsal region. Significant soft tissue swelling great toe and medial to 1st metatarsal. There is soft tissue irregularity probable wound in this region. No definite bone destruction or erosion to suggest osteomyelitis. Stable postsurgical changes in the base of 1st proximal phalanx.   Electronically Signed   By: Natasha Mead M.D.   On: 06/23/2013 12:33    Review of Systems  Constitutional: Positive  for fever and chills.  HENT: Negative.   Eyes: Negative.   Respiratory: Negative.   Cardiovascular: Positive for leg swelling.  Gastrointestinal: Negative.   Genitourinary: Negative.   Musculoskeletal: Positive for joint pain.  Neurological: Positive for sensory change.  Endo/Heme/Allergies: Negative.   Psychiatric/Behavioral: Negative.     Blood pressure 116/55, pulse 76, temperature 97.7 F (36.5 C), temperature source Oral, resp. rate 20, height 5\' 4"  (1.626 m), weight 196 lb 14.4 oz (89.313 kg), SpO2 97.00%. Physical Exam  Constitutional: She is oriented to person, place, and time. She appears well-developed and well-nourished.  HENT:  Head: Normocephalic and atraumatic.  Cardiovascular: Normal rate and regular rhythm.   Pulses:      Dorsalis pedis pulses are 2+ on the right side, and 2+ on the left side.       Posterior tibial pulses are 1+ on the right side, and 1+ on the left side.  Capillary refill time 3 seconds all digits. Skin temperature is warm turgor normal +1 edema right lower extremity. No varicosities are noted  Respiratory: Effort normal and breath sounds normal.  Musculoskeletal: She exhibits edema.       Right foot: She exhibits deformity.  Marjorie biomechanical exam reveals notable contractures of toes 2 through 5 history of previous dictation third toe right. Residual bunion deformities with arthrosis first MTP area bilateral with hallux malleus deformity bilateral. There is associated ulceration sub-first MTP area right with recent blistering last 2 days extending to the medial and dorsal surface of first MTP area. There is necrosis and purulent discharge or drainage of this ulcer site right great toe joint. Next  Trees and MRI reports were reviewed and they severe arthrosis of the foot however the joint capsule and bone were intact no definitive signs of osteomyelitis were noted no significant abscess this is more likely a blister or superficial infective  process.  Neurological: She is alert and oriented to person, place, and time. She has normal strength.  Epicritic sensations grossly diminished bilateral on Semmes Weinstein testing to forefoot digits and plantar arch. DTRs not elicited. Bilateral normal plantar response noted bilateral.  Skin: Skin is warm and dry. No cyanosis. Nails show no clubbing.  Skin color pigment normal hair growth profoundly absent. There is history of long-term ulceration sub-first MTP area right proxy 1 cm in diameter however in the last 2 days this is extended to a 3 x 2 3 x 5 cm ulceration over the medial and dorsal surface of great toe joint there is  necrosis and slough with surrounding localized edema and erythema. Does not probe down to bone.  Psychiatric: She has a normal mood and affect. Her behavior is normal.     Assessment/Plan Diabetes with peripheral neuropathy, and associated deformity and arthrosis. Ulceration and cellulitis secondary to deformity first MTP joint right foot Plan at this time is for surgical debridement of ulcer and surrounding necrotic tissue. Patient be taken to the OR under IV sedation and regional block. Plan for debridement of skin subcutaneous tissue and possible need for debridement of involved bone if determined necessary.  Stephanie Brock 06/24/2013, 12:17 PM

## 2013-06-24 NOTE — Transfer of Care (Signed)
Immediate Anesthesia Transfer of Care Note  Patient: Stephanie Brock  Procedure(s) Performed: Procedure(s): IRRIGATION AND DEBRIDEMENT, partial resection of right great toe and closure (Right)  Patient Location: PACU  Anesthesia Type:General  Level of Consciousness: awake, alert  and oriented  Airway & Oxygen Therapy: Patient Spontanous Breathing and Patient connected to face mask oxygen  Post-op Assessment: Report given to PACU RN and Post -op Vital signs reviewed and stable  Post vital signs: Reviewed and stable  Complications: No apparent anesthesia complications

## 2013-06-24 NOTE — Anesthesia Preprocedure Evaluation (Signed)
Anesthesia Evaluation  Patient identified by MRN, date of birth, ID band Patient awake    Reviewed: Allergy & Precautions, H&P , NPO status , Patient's Chart, lab work & pertinent test results, reviewed documented beta blocker date and time   Airway Mallampati: II TM Distance: >3 FB Neck ROM: Full    Dental  (+) Dental Advisory Given, Partial Upper and Partial Lower Pt has partials but they are not in mouth nor with patient:   Pulmonary asthma ,          Cardiovascular Exercise Tolerance: Poor hypertension, Pt. on medications and Pt. on home beta blockers +CHF     Neuro/Psych    GI/Hepatic GERD-  Medicated,  Endo/Other  diabetes, Insulin DependentHypothyroidism   Renal/GU      Musculoskeletal   Abdominal   Peds  Hematology   Anesthesia Other Findings   Reproductive/Obstetrics                           Anesthesia Physical Anesthesia Plan  ASA: III  Anesthesia Plan: General   Post-op Pain Management:    Induction: Intravenous  Airway Management Planned: LMA  Additional Equipment:   Intra-op Plan:   Post-operative Plan: Extubation in OR  Informed Consent: I have reviewed the patients History and Physical, chart, labs and discussed the procedure including the risks, benefits and alternatives for the proposed anesthesia with the patient or authorized representative who has indicated his/her understanding and acceptance.   Dental advisory given  Plan Discussed with: CRNA, Anesthesiologist and Surgeon  Anesthesia Plan Comments:         Anesthesia Quick Evaluation

## 2013-06-24 NOTE — Op Note (Signed)
Dr. Alvan Dame dictating operative note on Stephanie Brock; Date of surgery 06/25/2023  Surgeon: Alvan Dame DPM Preoperative diagnosis: Diabetic ulcer with cellulitis first MTP area right foot Postoperative diagnosis: Same with the addition intraoperatively it was determined necrotic tissue extended to bone involvement of the entire first MTP joint. The capsule was completely compromised by infection, working the resection of the entire joint and partial first ray Procedure I&D of ulcer with subsequent debridement of necrotic tissue and bone including first ray resection and toe amputation. Right foot Anesthesia: MAC/general Hemostasis: Calf tourniquet at 250 mm/hg. Estimated blood  loss: Less than 5 cc Indication for surgery: Patient had a long-standing history of ulcer right great toe joint. Within the last 2 days has developed more severe infection localized cellulitis extending to the medial and dorsal surface of the great toe joint. Has developed fever and chills and significant purulent discharge or drainage. Was admitted on 06/24/2023 to service of [facility]. X-rays and MRI were not definitive for osteomyelitis however on opening the wound necrotic tissue and infective process extended into the joint working deeper debridement and resection of involved bone in tissue. This had been previously discussed with the patient she was aware that resection of bone and partial ray resection would be necessary.  Findings and procedure: Patient was brought into the OR placed on the table in the supine position. General anesthesia was achieved and the foot was prepped utilizing Betadine solution the the foot was exsanguinated Esmarch wrap and calf tourniquet was inflated to 250 mm mercury the following procedure was then carried out.  Debridement diabetic ulcer/abscess right foot with subsequent ray resection in great toe amputation. Attention was directed to the dorsomedial aspect of the  right foot there is necrotic and friable tissue over the dorsal medial and plantar surface of the right first MTP joint. At this time utilizing sharp dissection the necrotic tissue was debrided from the surface and on debridement it was noted that the tissue did extend with discharge drainage extended onto the dorsal and medial capsule the first MTP joint to joint of the joint did have subluxation and disarticulation at the MTP joint. At this time was determined that simple debridement would not be adequate and resection of the bone and partial ray resection toe amputation be necessary. At this time the skin incision was made in a elliptical fashion proximally over the medial surface of the first metatarsal midshaft the capsular tissues were reflected proximally and a transverse incision was made he was encouraged limitation at this time the bone in first MTP capsule was excised in toto from the operative site. The first metatarsal head was removed separately from the base of the proximal phalanx. The base proximal phalanx is also resected as was the remaining sesamoid apparatus and flexor and extensor tendons. All necrotic tissue had been resected the skin edges were then cleaned and prepped for possible closure. It was determined it was adequate skin for closure following the resection of the first ray for prior to closure deep cultures were obtained. Aerobic and anaerobic culture swabs were obtained of the first MTP joint resection site.  Following cultures the operative site was thoroughly cleansed with pulse lavage utilizing at least 1-2 L of sterile saline solution. All necrotic tissue was debrided away and cleaned skin edges were noted. Capsular tissue that was preserved at the metatarsal neck was then utilized to repair closure over the metatarsal shaft osteotomy site. Utilizing 3-0 Vicryl. Skin was then carefully closed utilizing 4-0  nylon and accommodation simple interrupted and horizontal mattress  fashions creasing almost complete closure of the wound site. Once closed Xeroform and a dry sterile compressive dressing was applied with Ace wrap. Ankle tourniquet deflated with immediate return of perfusion to the remaining digits was noted. Patient was then returned from the arch recovery in satisfactory condition .  Should the patient would stay in the PACU per anesthesia and then transferred back to [facility] of [facility]. Plan will be to maintain her current regimen of antibiotic. We'll monitor previous and current most recent cultures. Plan for updated x-rays as well.  Alvan Dame DPM

## 2013-06-25 DIAGNOSIS — L97509 Non-pressure chronic ulcer of other part of unspecified foot with unspecified severity: Secondary | ICD-10-CM

## 2013-06-25 MED ORDER — DOXYCYCLINE HYCLATE 100 MG PO TABS
100.0000 mg | ORAL_TABLET | Freq: Two times a day (BID) | ORAL | Status: DC
  Administered 2013-06-26: 100 mg via ORAL
  Filled 2013-06-25 (×2): qty 1

## 2013-06-25 MED ORDER — LOPERAMIDE HCL 2 MG PO CAPS
2.0000 mg | ORAL_CAPSULE | ORAL | Status: DC | PRN
  Administered 2013-06-25: 19:00:00 2 mg via ORAL
  Filled 2013-06-25 (×2): qty 1

## 2013-06-25 MED ORDER — PROMETHAZINE HCL 25 MG/ML IJ SOLN
12.5000 mg | Freq: Once | INTRAMUSCULAR | Status: AC
  Administered 2013-06-25: 15:00:00 12.5 mg via INTRAVENOUS
  Filled 2013-06-25: qty 1

## 2013-06-25 NOTE — Progress Notes (Signed)
Stephanie Brock is a 72 y.o. female patient. 1. Cellulitis   2. Diabetic foot ulcer   3. Anemia   4. Chronic CHF   5. Diabetic infection of right foot   6. Diabetic neuropathy   7. Essential hypertension, benign   8. GERD (gastroesophageal reflux disease)   9. Insulin dependent diabetes mellitus   10. Chronic diastolic CHF (congestive heart failure), NYHA class 2    Past Medical History  Diagnosis Date  . Diabetes mellitus without complication   . Allergy   . Arthritis   . Asthma   . Blood transfusion without reported diagnosis   . Cancer   . Cataract   . Clotting disorder   . CHF (congestive heart failure)   . GERD (gastroesophageal reflux disease)   . Hyperlipidemia   . Hypertension   . Neuromuscular disorder   . Osteoporosis   . Stroke   . Thyroid disease   . Ulcer    Current Facility-Administered Medications  Medication Dose Route Frequency Provider Last Rate Last Dose  . acetaminophen (TYLENOL) tablet 650 mg  650 mg Oral Q6H PRN Christiane Ha, MD   650 mg at 06/25/13 0115  . dipyridamole-aspirin (AGGRENOX) 200-25 MG per 12 hr capsule 1 capsule  1 capsule Oral BID Christiane Ha, MD   1 capsule at 06/25/13 0908  . [START ON 06/26/2013] doxycycline (VIBRA-TABS) tablet 100 mg  100 mg Oral Q12H Vassie Loll, MD      . furosemide (LASIX) tablet 80 mg  80 mg Oral Daily Christiane Ha, MD   80 mg at 06/25/13 0907  . gabapentin (NEURONTIN) tablet 600 mg  600 mg Oral QHS Christiane Ha, MD   600 mg at 06/24/13 2204  . heparin injection 5,000 Units  5,000 Units Subcutaneous Q8H Christiane Ha, MD   5,000 Units at 06/25/13 1432  . HYDROcodone-acetaminophen (NORCO) 10-325 MG per tablet 1 tablet  1 tablet Oral Q6H PRN Alvan Dame, DPM   1 tablet at 06/25/13 1623  . insulin aspart protamine- aspart (NOVOLOG MIX 70/30) injection 60 Units  60 Units Subcutaneous BID WC Christiane Ha, MD   60 Units at 06/25/13 0908  . ipratropium (ATROVENT) 0.06 % nasal spray  2 spray  2 spray Nasal BID Christiane Ha, MD   2 spray at 06/25/13 1226  . levothyroxine (SYNTHROID, LEVOTHROID) tablet 75 mcg  75 mcg Oral QAC breakfast Christiane Ha, MD   75 mcg at 06/25/13 1610  . loperamide (IMODIUM) capsule 2 mg  2 mg Oral PRN Vassie Loll, MD      . metoprolol succinate (TOPROL-XL) 24 hr tablet 50 mg  50 mg Oral Daily Christiane Ha, MD   50 mg at 06/25/13 0907  . ondansetron (ZOFRAN) tablet 4 mg  4 mg Oral Q6H PRN Christiane Ha, MD       Or  . ondansetron [facility]) injection 4 mg  4 mg Intravenous Q6H PRN Christiane Ha, MD   4 mg at 06/25/13 1107  . oxyCODONE (Oxy IR/ROXICODONE) immediate release tablet 5 mg  5 mg Oral Q4H PRN Christiane Ha, MD   5 mg at 06/25/13 0319  . pantoprazole (PROTONIX) EC tablet 40 mg  40 mg Oral Daily Christiane Ha, MD   40 mg at 06/25/13 9604  . potassium chloride SA (K-DUR,KLOR-CON) CR tablet 40 mEq  40 mEq Oral BID Christiane Ha, MD   40 mEq at 06/25/13 0907  . pregabalin (  LYRICA) capsule 100 mg  100 mg Oral BID PRN Christiane Ha, MD      . rOPINIRole (REQUIP) tablet 1 mg  1 mg Oral q morning - 10a Christiane Ha, MD   1 mg at 06/25/13 0907  . rOPINIRole (REQUIP) tablet 3 mg  3 mg Oral q morning - 10a Christiane Ha, MD   3 mg at 06/25/13 0907  . senna-docusate (Senokot-S) tablet 1 tablet  1 tablet Oral QHS PRN Christiane Ha, MD      . simvastatin (ZOCOR) tablet 5 mg  5 mg Oral QHS Christiane Ha, MD   5 mg at 06/24/13 2205  . spironolactone (ALDACTONE) tablet 100 mg  100 mg Oral Daily Christiane Ha, MD   100 mg at 06/25/13 1610   Allergies  Allergen Reactions  . Iodine   . Penicillins     whelps  . Shrimp [Shellfish Allergy]     Whelps   . Tape   . Codeine Rash   Principal Problem:   Diabetic infection of right foot Active Problems:   Insulin dependent diabetes mellitus   Essential hypertension, benign   Diabetic neuropathy   Unspecified hypothyroidism    Anemia   Other and unspecified hyperlipidemia   Chronic diastolic CHF (congestive heart failure), NYHA class 2   GERD (gastroesophageal reflux disease)  Blood pressure 125/49, pulse 65, temperature 97.9 F (36.6 C), temperature source Oral, resp. rate 18, height 5\' 4"  (1.626 m), weight 192 lb 14.4 oz (87.499 kg), SpO2 95.00%.  Subjective: Symptoms:  Stable.   Diet:  She reports  nausea.   Activity level: Impaired due to pain.   Pain:  She complains of pain that is mild.  She reports pain is improving.  Pain is well controlled.    Objective: General Appearance:  Comfortable, well-appearing and in no acute distress.   Vital signs: (most recent): Blood pressure 125/49, pulse 65, temperature 97.9 F (36.6 C), temperature source Oral, resp. rate 18, height 5\' 4"  (1.626 m), weight 192 lb 14.4 oz (87.499 kg), SpO2 95.00%.  Vital signs are normal.  No fever.   Extremities: (Dressing of right foot intact and dry. Mild foot and leg tenderness.normal temp on palpation bilateral.CFT 2-3 sec to all toes.) Neurological: Patient is alert and oriented to person, place and time.   Skin:  Warm and dry.    Assessment:  Condition: In stable condition.   (No sign of infection or distress. Post op foot apears stable. X-rays show adequate resection of damaged tissue. Would benefit from limited protected activity and ambulation. Consider a Cam-boot for right foot. Patient wouls benefit from rehab facility stay on discharge .  ).   Plan:  Discharge home (or other rehab type facility for short term.Per decision of [facility]...).  Activity Plan: limited ambulation with Cam boot  and walker for assistance.  Consults: podiatry followup out patient at [facility] office [facility].  Resume oral medications and resume home regimen (per hosptalists can convert from IV to PO antibiotics for D/c).   (From a podiatry perspective I am comfortable with any discharge pl;ans per [facility] care team.    Dandre Sisler DPM [phone]).     [name] 06/25/2013

## 2013-06-25 NOTE — Progress Notes (Signed)
PT Cancellation Note  Patient Details Name: Melat Wrisley MRN: [id number] DOB: 1941-03-09   Cancelled Treatment:    Reason Eval/Treat Not Completed: Patient not medically ready;Medical issues which prohibited therapy. Pt decline due to n/v and diarrhea. Pt and RN reported pt is not D/C today. Will re-attempt evaluation at a time when pt is appropriate and agreeable.    Donnamarie Poag [place], [place] [phone] 06/25/2013, 2:35 PM

## 2013-06-25 NOTE — Progress Notes (Signed)
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 Ralene Cork in the outpatient setting for further evaluation and treatment of her wound after amputation. -Continue Neurontin for neuropathic pain  Insulin dependent diabetes mellitus - cont insulin 70/30. CBG"s well controlled.   Essential hypertension, benign - controlled, monitor. -Continue current medication regimen.  Chronic in  Nature Anemia: - MCV 95. Will need further follow up as an outpatient. -PCP to perform follow RBC folate and B12 level  Chronic diastolic HF - Compensated. - Cont lasix, metoprolol, spironolactone. - Continue daily weight and strict intake and output.   GERD (gastroesophageal reflux disease) - PPI.  Nausea/vomiting and diarrhea -C. difficile negative. -Will treat symptomatically with the use of as needed antiemetics and Imodium -Most likely secondary to antibiotics  DVT:. Heparin   Code Status:Full  Family Communication: no family at bedside Disposition Plan: home   Consultants:  Ortho  PT  Procedures:  MRI  Foot x-ray  Antibiotics:  vanc and imipenem 10/20--10/22  Doxycycline 10/23  HPI/Subjective: Status post partial right first toe amputation; experiencing some diarrhea, nausea/vomiting slightly do to antibiotics. Patient is C. difficile negative.  Objective: Filed Vitals:   06/25/13 0110 06/25/13 0536 06/25/13 0907 06/25/13 1512  BP: 105/55 114/53 121/48 125/49  Pulse: 64 58 67 65  Temp: 97.7 F (36.5 C) 97.4 F (36.3 C)  97.9 F (36.6 C)  TempSrc: Oral Oral  Oral  Resp: 20 18  18   Height:      Weight:  87.499 kg (192 lb 14.4 oz)    SpO2: 100% 98%  95%     Intake/Output Summary (Last 24 hours) at 06/25/13 1725 Last data filed at 06/25/13 1600  Gross per 24 hour  Intake   1080 ml  Output   4560 ml  Net  -3480 ml   Filed Weights   06/23/13 1530 06/24/13 0531 06/25/13 0536  Weight: 88.5 kg (195 lb 1.7 oz) 89.313 kg (196 lb 14.4 oz) 87.499 kg (192 lb 14.4 oz)    Exam:  General: Alert, awake, oriented x3, in mild distress secondary to ongoing diarrhea, nausea/vomiting.  HEENT: No bruits, no goiter.  Heart: Regular rate and rhythm, without murmurs, rubs, gallops.  Lungs: Good air movement, clear to auscultation.  Abdomen: Soft, nontender, nondistended, positive bowel sounds.  Neuro: Grossly intact, nonfocal.  Data Reviewed: Basic Metabolic Panel:  Recent Labs Lab 06/23/13 1241  NA 135  K 4.3  CL 98  CO2 27  GLUCOSE 143*  BUN 13  CREATININE 1.05  CALCIUM 9.4   CBC:  Recent Labs Lab 06/23/13 1241  WBC 14.0*  NEUTROABS 10.9*  HGB 10.9*  HCT 33.7*  MCV 95.5  PLT 241   Cardiac Enzymes: No results found for this basename: CKTOTAL, CKMB, CKMBINDEX, TROPONINI,  in the last 168 hours BNP (last 3 results) No results found for this basename: PROBNP,  in the last 8760 hours CBG:  Recent Labs Lab 06/24/13 0748 06/24/13 1636 06/24/13 2024 06/24/13 2110 06/25/13 0805  GLUCAP 83 93 77 90 115*    Recent Results (from the past 240 hour(s))  WOUND CULTURE     Status: None   Collection Time    06/23/13  3:51 PM      Result Value  Range Status   Specimen Description WOUND FOOT RIGHT   Final   Special Requests NONE   Final   Gram Stain     Final   Value: NO WBC SEEN     NO SQUAMOUS EPITHELIAL CELLS SEEN     RARE GRAM POSITIVE COCCI IN PAIRS     Performed at [facility]   Culture     Final   Value: Culture reincubated for better growth     Performed at [facility]   Report Status PENDING   Incomplete  CLOSTRIDIUM DIFFICILE BY PCR     Status: None   Collection Time    06/24/13 12:52 PM       Result Value Range Status   C difficile by pcr NEGATIVE  NEGATIVE Final     Studies: Mr Foot Right W Wo Contrast  06/24/2013   CLINICAL DATA:  Diabetic foot infection. Osteomyelitis. Redness and drainage with blistering. 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  EXAM: MRI OF THE RIGHT FOREFOOT WITHOUT AND WITH CONTRAST  TECHNIQUE: Multiplanar, multisequence MR imaging was performed both before and after administration of intravenous contrast.  CONTRAST:  19mL MULTIHANCE GADOBENATE DIMEGLUMINE 529 MG/ML IV SOLN  COMPARISON:  Radiographs 06/23/2013.  FINDINGS: Diffuse subcutaneous infiltration/edema is present in the forefoot. There is subcutaneous edema and post gadolinium enhancement compatible with soft tissue infection. There is a swath of necrotic subcutaneous fat and an area of ulceration medial to the distal 1st metatarsal shaft and extending to the 1st MTP joint (image 8 series 13). The necrotic tissue undermines the skin surface. This is compatible with an area of ulceration and skin necrosis. There is no abscess. Cellulitis radiates from this necrotic tissue. Despite the proximity to the 1st metatarsal head, there is no osteomyelitis. Mild reactive bone marrow edema is present in the 1st metatarsal head which appears due to combination of degenerative disease and hypervascularity nearby. Postsurgical changes of bunionectomy are present with susceptibility artifact in the medial proximal phalanx of the great toe. Old 5th metatarsal fracture is incidentally noted. Severe degenerative changes of the midfoot are present, most compatible with neuropathic midfoot in this diabetic patient.  IMPRESSION: 1. Forefoot cellulitis centered around the medial aspect of the 1st MTP joint. 2. Ulceration over the medial 1st MTP joint with about 5 cm area of necrotic subcutaneous tissue extending up to the ulcer. No abscess. 3. Neuropathic midfoot. 4. Old posttraumatic and postoperative changes of the foot.   Electronically  Signed   By: Andreas Newport M.D.   On: 06/24/2013 09:44   Dg Foot Complete Right  06/24/2013   CLINICAL DATA:  Post amputation  EXAM: RIGHT FOOT COMPLETE - 3+ VIEW  COMPARISON:  06/23/2013  FINDINGS: Interval amputation mid 1st metatarsal.  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 Chronic fracture 5th metatarsal  No acute fracture or osteomyelitis. Degenerative changes in the midfoot and ankle.  IMPRESSION: No acute fracture or osteomyelitis post amputation of the mid 1st metatarsal.   Electronically Signed   By: Marlan Palau M.D.   On: 06/24/2013 23:17    Scheduled Meds: . dipyridamole-aspirin  1 capsule Oral BID  . furosemide  80 mg Oral Daily  . gabapentin  600 mg Oral QHS  . heparin  5,000 Units Subcutaneous Q8H  . insulin aspart protamine- aspart  60 Units Subcutaneous BID WC  . ipratropium  2 spray Nasal BID  . levothyroxine  75  mcg Oral QAC breakfast  . metoprolol  50 mg Oral Daily  . pantoprazole  40 mg Oral Daily  . potassium chloride SA  40 mEq Oral BID  . rOPINIRole  1 mg Oral q morning - 10a  . rOPINIRole  3 mg Oral q morning - 10a  . simvastatin  5 mg Oral QHS  . spironolactone  100 mg Oral Daily  . vancomycin  1,500 mg Intravenous Q24H   Continuous Infusions:    Stephanie Brock  [facility] Pager [phone]. If 8PM-8AM, please contact night-coverage at [email or web address], password [facility] 06/25/2013, 5:25 PM  LOS: 2 days

## 2013-06-25 NOTE — Progress Notes (Signed)
Patient evaluated for community based chronic disease management services with [facility] Care Management Program as a benefit of patient's [facility] Medicare Insurance.  Referred patient to Quentin Angst RN [facility] Liaison [phone]) for diabetes management.  Made Reeves [facility] made aware of referral.  Of note, [facility] Care Management services does not replace or interfere with any services that are arranged by inpatient case management or social work.  For additional questions or referrals please contact Anibal Henderson BSN RN [facility] Liaison at [phone].

## 2013-06-25 NOTE — Progress Notes (Signed)
PT Cancellation Note  Patient Details Name: Stephanie Brock MRN: [id number] DOB: 1941/05/14   Cancelled Treatment:    Reason Eval/Treat Not Completed: Medical issues which prohibited therapy (nausea/vomiting/diarrhea). Verbal order for PT eval received from Dr Gwenlyn Perking. Pt unable to tolerate mobility at this time 2* n/v/d. Will follow.    Ralene Bathe Kistler 06/25/2013, 11:43 AM [phone]

## 2013-06-25 NOTE — Progress Notes (Signed)
Orthopedic Tech Progress Note Patient Details:  Stephanie Brock Apr 18, 1941 [id number]  Ortho Devices Type of Ortho Device: CAM walker Ortho Device/Splint Interventions: Application   Cammer, Mickie Bail 06/25/2013, 1:53 PM

## 2013-06-25 NOTE — Progress Notes (Signed)
Co-signed for LaTisha Teasley RN/BSN for assessments, IV assessments, medication administration, I's and O's, vital signs, progress notes, and care plans. Sharan Mcenaney M, RN/BSN 

## 2013-06-25 NOTE — Progress Notes (Signed)
Co-signed for LaTisha Teasley RN/BSN for assessments, IV assessments, medication administration, I's and O's, vital signs, progress notes, and care plans. Macon Lesesne M, RN/BSN 

## 2013-06-26 ENCOUNTER — Encounter (HOSPITAL_COMMUNITY): Payer: Self-pay

## 2013-06-26 DIAGNOSIS — E039 Hypothyroidism, unspecified: Secondary | ICD-10-CM

## 2013-06-26 LAB — BASIC METABOLIC PANEL
BUN: 16 mg/dL (ref 6–23)
CO2: 30 mEq/L (ref 19–32)
Calcium: 9.7 mg/dL (ref 8.4–10.5)
Creatinine, Ser: 1.12 mg/dL — ABNORMAL HIGH (ref 0.50–1.10)
GFR calc Af Amer: 55 mL/min — ABNORMAL LOW (ref >90)
GFR calc non Af Amer: 48 mL/min — ABNORMAL LOW (ref >90)
Glucose, Bld: 86 mg/dL (ref 70–99)
Potassium: 4.2 mEq/L (ref 3.5–5.1)

## 2013-06-26 MED ORDER — DOXYCYCLINE HYCLATE 100 MG PO TABS: 100.0000 mg | ORAL_TABLET | Freq: Two times a day (BID) | ORAL | Status: AC

## 2013-06-26 MED ORDER — SODIUM CHLORIDE 0.9 % IV SOLN
Freq: Once | INTRAVENOUS | Status: AC
  Administered 2013-06-26: 09:00:00 via INTRAVENOUS

## 2013-06-26 MED ORDER — HYDROCODONE-ACETAMINOPHEN 10-325 MG PO TABS: 1.0000 | ORAL_TABLET | Freq: Four times a day (QID) | ORAL | Status: AC | PRN

## 2013-06-26 NOTE — Discharge Summary (Signed)
Physician Discharge Summary  Stephanie Brock [name] DOB: 03-09-1941 DOA: 06/23/2013  PCP: Stephanie Pai, MD  Admit date: 06/23/2013 Discharge date: 06/26/2013  Time spent: >30 minutes  Recommendations for Outpatient Follow-up:  CBC to follow Hgb and WBC's trend BMET to follow electrolytes and renal function Work up for elevated MCV (macrocitic anemia) Follow final results of wound culture  Discharge Diagnoses:  Principal Problem:   Diabetic infection of right foot Active Problems:   Insulin dependent diabetes mellitus   Essential hypertension, benign   Diabetic neuropathy   Unspecified hypothyroidism   Anemia   Other and unspecified hyperlipidemia   Chronic diastolic CHF (congestive heart failure), NYHA class 2   GERD (gastroesophageal reflux disease)   Discharge Condition: stable and improved. Will discharge home with HHPT and HHOT services  Diet recommendation: heart healthy diet  Filed Weights   06/24/13 0531 06/25/13 0536 06/26/13 0454  Weight: 89.313 kg (196 lb 14.4 oz) 87.499 kg (192 lb 14.4 oz) 84.732 kg (186 lb 12.8 oz)    History of present illness:  72 y.o. female with DM and neuropathy who presents with right foot infection. 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 Ralene Cork. Had redness, malodorous drainage and blistering, which burst. Also, F/C. Multiple medical problems stable. In ed, temp 101.3 WBC 14K. Xray shows no osteo or foreign body.   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 Ralene Cork in the outpatient setting for further evaluation and treatment of her wound after amputation.  -Continue Neurontin for neuropathic pain   Insulin  dependent diabetes mellitus  -cont insulin 70/30. CBG's well controlled during admission.   Essential hypertension, benign  -controlled, monitor.  -Continue current medication regimen.   Chronic in Nature Anemia:  - MCV 95. Will need further follow up as an outpatient.  -PCP to perform follow RBC folate and B12 level   Chronic diastolic HF  - Compensated.  - Cont lasix, metoprolol, spironolactone.  - Continue daily weight and heart healthy diet.   GERD (gastroesophageal reflux disease)  -continue PPI.   Nausea/vomiting and diarrhea  -C. difficile negative.  -Most likely secondary to antibiotics -improved/resolved prior to discharge with symptomatic treatment (imodium and antiemetics)   Procedures:  See below for x-ray reports  S/p partial amputation right first toe  Consultations:  Dr. Ralene Cork (podiatrist)  PT  Discharge Exam: Filed Vitals:   06/26/13 1345  BP: 126/57  Pulse: 65  Temp: 97.5 F (36.4 C)  Resp: 17   General: Alert, awake, oriented x3, NAD; no further diarrhea, N/V resolved HEENT: No bruits, no goiter.  Heart: Regular rate and rhythm, without murmurs, rubs, gallops.  Lungs: Good air movement, clear to auscultation.  Abdomen: Soft, nontender, nondistended, positive bowel sounds.  Neuro: Grossly intact, nonfocal.   Discharge Instructions  Discharge Orders   Future Appointments Provider Department Dept Phone   07/02/2013 11:15 AM Alvan Dame, DPM [facility] [phone]   Future Orders Complete By Expires   Diet - low sodium heart healthy  As directed    Discharge instructions  As directed    Comments:     Follow with PCP in 10 days Follow with Dr. Ralene Cork in 1 week Keep yourself well  hydrated   Face-to-face encounter (required for Medicare/Medicaid patients)  As directed    Comments:     I Ronson Hagins certify that this patient is under my care and that I, or a nurse practitioner or physician's assistant working with  me, had a face-to-face encounter that meets the physician face-to-face encounter requirements with this patient on 06/26/2013. The encounter with the patient was in whole, or in part for the following medical condition(s) which is the primary reason for home health care (List medical condition): physical deconditioning; status post partial amputation of right big toe; weight bearing will be limited for the next 2 weeks; patient with hx of stroke and unbalance at baseline   Questions:     The encounter with the patient was in whole, or in part, for the following medical condition, which is the primary reason for home health care:  physical deconditioning; status post partial amputation of right big toe; weight bearing will be limited for the next 2 weeks; patient with hx of stroke and unbalance at baseline   I certify that, based on my findings, the following services are medically necessary home health services:  Physical therapy   My clinical findings support the need for the above services:  Unable to leave home safely without assistance and/or assistive device   Further, I certify that my clinical findings support that this patient is homebound due to:  Unable to leave home safely without assistance   Reason for Medically Necessary Home Health Services:  Therapy- Investment banker, operational, Patent examiner   Therapy- Instruction on use of Assistive Device for Ambulation on all Surfaces   Therapy- Instruction on Safe use of Assistive Devices for ADLs   Home Health  As directed    Questions:     To provide the following care/treatments:  PT   OT   Increase activity slowly  As directed        Medication List    STOP taking these medications       silver sulfADIAZINE 1 % cream  Commonly known as:  SILVADENE      TAKE these medications       acetaminophen 500 MG tablet  Commonly known as:  TYLENOL  Take 500-1,000 mg by mouth 2 (two) times daily after a meal. Take 2 tablets a  bedtime and 1 during the day if needed     dipyridamole-aspirin 200-25 MG per 12 hr capsule  Commonly known as:  AGGRENOX  Take 1 capsule by mouth 2 (two) times daily.     doxycycline 100 MG tablet  Commonly known as:  VIBRA-TABS  Take 1 tablet (100 mg total) by mouth every 12 (twelve) hours.     furosemide 80 MG tablet  Commonly known as:  LASIX  Take 80 mg by mouth daily.     gabapentin 600 MG tablet  Commonly known as:  NEURONTIN  Take 600 mg by mouth at bedtime.     HYDROcodone-acetaminophen 10-325 MG per tablet  Commonly known as:  NORCO  Take 1 tablet by mouth every 6 (six) hours as needed for pain.     insulin lispro protamine-lispro (75-25) 100 UNIT/ML Susp injection  Commonly known as:  HUMALOG 75/25  Inject 60 Units into the skin 2 (two) times daily with a meal.     ipratropium 0.06 % nasal spray  Commonly known as:  ATROVENT  Place 2 sprays into the nose 2 (two) times daily.     levothyroxine 75 MCG  tablet  Commonly known as:  SYNTHROID, LEVOTHROID  Take 75 mcg by mouth daily before breakfast.     metoprolol 200 MG 24 hr tablet  Commonly known as:  TOPROL-XL  Take 200 mg by mouth daily.     pantoprazole 40 MG tablet  Commonly known as:  PROTONIX  Take 40 mg by mouth daily.     potassium chloride SA 20 MEQ tablet  Commonly known as:  K-DUR,KLOR-CON  Take 40 mEq by mouth 2 (two) times daily.     pregabalin 100 MG capsule  Commonly known as:  LYRICA  Take 100 mg by mouth 2 (two) times daily as needed (pt takes 1 capsule at bedtime and may take 1 capsule after luinch if needed).     rOPINIRole 1 MG tablet  Commonly known as:  REQUIP  Take 1 mg by mouth every morning. Total of 4mg = take with 3mg  tablet     rOPINIRole 3 MG tablet  Commonly known as:  REQUIP  Take 3 mg by mouth every morning. Total of 4mg = take with 3mg  tablet     simvastatin 5 MG tablet  Commonly known as:  ZOCOR  Take 5 mg by mouth at bedtime.     spironolactone 100 MG tablet   Commonly known as:  ALDACTONE  Take 100 mg by mouth daily.       Allergies  Allergen Reactions  . Iodine   . Penicillins     whelps  . Shrimp [Shellfish Allergy]     Whelps   . Tape   . Codeine Rash       Follow-up Information   Follow up with KRASOVICH,JANELLE, MD. Schedule an appointment as soon as possible for a visit in 10 days.   Specialty:  Internal Medicine       The results of significant diagnostics from this hospitalization (including imaging, microbiology, ancillary and laboratory) are listed below for reference.    Significant Diagnostic Studies: Mr Foot Right W Wo Contrast  06/24/2013   CLINICAL DATA:  Diabetic foot infection. Osteomyelitis. Redness and drainage with blistering. 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  EXAM: MRI OF THE RIGHT FOREFOOT WITHOUT AND WITH CONTRAST  TECHNIQUE: Multiplanar, multisequence MR imaging was performed both before and after administration of intravenous contrast.  CONTRAST:  19mL MULTIHANCE GADOBENATE DIMEGLUMINE 529 MG/ML IV SOLN  COMPARISON:  Radiographs 06/23/2013.  FINDINGS: Diffuse subcutaneous infiltration/edema is present in the forefoot. There is subcutaneous edema and post gadolinium enhancement compatible with soft tissue infection. There is a swath of necrotic subcutaneous fat and an area of ulceration medial to the distal 1st metatarsal shaft and extending to the 1st MTP joint (image 8 series 13). The necrotic tissue undermines the skin surface. This is compatible with an area of ulceration and skin necrosis. There is no abscess. Cellulitis radiates from this necrotic tissue. Despite the proximity to the 1st metatarsal head, there is no osteomyelitis. Mild reactive bone marrow edema is present in the 1st metatarsal head which appears due to combination of degenerative disease and hypervascularity nearby. Postsurgical changes of bunionectomy are present with susceptibility artifact in the medial proximal phalanx of the great toe.  Old 5th metatarsal fracture is incidentally noted. Severe degenerative changes of the midfoot are present, most compatible with neuropathic midfoot in this diabetic patient.  IMPRESSION: 1. Forefoot cellulitis centered around the medial aspect of the 1st MTP joint. 2. Ulceration over the medial 1st MTP joint with about 5 cm area of necrotic subcutaneous tissue extending  up to the ulcer. No abscess. 3. Neuropathic midfoot. 4. Old posttraumatic and postoperative changes of the foot.   Electronically Signed   By: Andreas Newport M.D.   On: 06/24/2013 09:44   Dg Foot Complete Right  06/24/2013   CLINICAL DATA:  Post amputation  EXAM: RIGHT FOOT COMPLETE - 3+ VIEW  COMPARISON:  06/23/2013  FINDINGS: Interval amputation mid 1st metatarsal.  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 Chronic fracture 5th metatarsal  No acute fracture or osteomyelitis. Degenerative changes in the midfoot and ankle.  IMPRESSION: No acute fracture or osteomyelitis post amputation of the mid 1st metatarsal.   Electronically Signed   By: Marlan Palau M.D.   On: 06/24/2013 23:17   Dg Foot Complete Right  06/23/2013   CLINICAL DATA:  Nonhealing wound 1st metatarsal phalangeal joint  EXAM: RIGHT FOOT COMPLETE - 3+ VIEW  COMPARISON:  01/12/2007  FINDINGS: Three views of the right foot submitted. 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 There is diffuse osteopenia. Hallux valgus 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 2nd and 4th metatarsal phalangeal joint. There is plantar spurring of calcaneus. Dorsal spurring tarsal region. Significant soft tissue swelling great toe and medial to 1st metatarsal. There is soft tissue irregularity probable wound in this region. No definite bone destruction or erosion to suggest osteomyelitis. Stable postsurgical changes in the base of 1st proximal phalanx. Stable old fracture deformity 5th metatarsal.  IMPRESSION:  Again noted prior amputation of 3rd toe. There is diffuse osteopenia. Hallux valgus 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 2nd and 4th metatarsal phalangeal joint. There is plantar spurring of calcaneus. Dorsal spurring tarsal region. Significant soft tissue swelling great toe and medial to 1st metatarsal. There is soft tissue irregularity probable wound in this region. No definite bone destruction or erosion to suggest osteomyelitis. Stable postsurgical changes in the base of 1st proximal phalanx.   Electronically Signed   By: Natasha Mead M.D.   On: 06/23/2013 12:33    Microbiology: Recent Results (from the past 240 hour(s))  WOUND CULTURE     Status: None   Collection Time    06/23/13  3:51 PM      Result Value Range Status   Specimen Description WOUND FOOT RIGHT   Final   Special Requests NONE   Final   Gram Stain     Final   Value: NO WBC SEEN     NO SQUAMOUS EPITHELIAL CELLS SEEN     RARE GRAM POSITIVE COCCI IN PAIRS     Performed at [facility]   Culture     Final   Value: MODERATE STAPHYLOCOCCUS AUREUS     Note: RIFAMPIN AND GENTAMICIN SHOULD NOT BE USED AS SINGLE DRUGS FOR TREATMENT OF STAPH INFECTIONS.     MODERATE GROUP B STREP(S.AGALACTIAE)ISOLATED     Note: TESTING AGAINST S. AGALACTIAE NOT ROUTINELY PERFORMED DUE TO PREDICTABILITY OF AMP/PEN/VAN SUSCEPTIBILITY.     Performed at [facility]   Report Status PENDING   Incomplete  CLOSTRIDIUM DIFFICILE BY PCR     Status: None   Collection Time    06/24/13 12:52 PM      Result Value Range Status   C difficile by pcr NEGATIVE  NEGATIVE Final  ANAEROBIC CULTURE     Status: None   Collection Time    06/24/13  7:40 PM      Result Value Range Status  Specimen Description ABSCESS FOOT RIGHT   Final   Special Requests RIGHT FOOT DEEP   Final   Gram Stain     Final   Value: RARE WBC PRESENT,BOTH PMN AND MONONUCLEAR     NO SQUAMOUS EPITHELIAL CELLS SEEN     NO ORGANISMS SEEN      Performed at [facility]   Culture     Final   Value: NO ANAEROBES ISOLATED; CULTURE IN PROGRESS FOR 5 DAYS     Performed at [facility]   Report Status PENDING   Incomplete  CULTURE, ROUTINE-ABSCESS     Status: None   Collection Time    06/24/13  7:40 PM      Result Value Range Status   Specimen Description ABSCESS FOOT RIGHT   Final   Special Requests RIGHT FOOT DEEP   Final   Gram Stain PENDING   Incomplete   Culture     Final   Value: NO GROWTH 1 DAY     Performed at [facility]   Report Status PENDING   Incomplete     Labs: Basic Metabolic Panel:  Recent Labs Lab 06/23/13 1241 06/26/13 0400  NA 135 140  K 4.3 4.2  CL 98 100  CO2 27 30  GLUCOSE 143* 86  BUN 13 16  CREATININE 1.05 1.12*  CALCIUM 9.4 9.7   CBC:  Recent Labs Lab 06/23/13 1241  WBC 14.0*  NEUTROABS 10.9*  HGB 10.9*  HCT 33.7*  MCV 95.5  PLT 241   CBG:  Recent Labs Lab 06/24/13 1636 06/24/13 2024 06/24/13 2110 06/25/13 0805 06/26/13 0758  GLUCAP 93 77 90 115* 94    Signed:  Thurza Kwiecinski  [facility] 06/26/2013, 3:35 PM

## 2013-06-26 NOTE — Progress Notes (Addendum)
06/26/13 1344  PT Visit Information  Last PT Received On 06/26/13  Assistance Needed +1  History of Present Illness Pt admit for right great toe partial resection.  Pt states her husband found 2 walkers and a 3N1 at home she can use.  Pt aware that PT recommending HHPT f/u and use of scooter and to perform transfers only initially when she is alone as pt is at risk of falls if ambulating with RW without someone present.  Pt agrees.  Recommending home with HHPT f/u and 24 hour care initially which pt states they are arranging.    PT Time Calculation  PT Start Time 1122  PT Stop Time 1140  PT Time Calculation (min) 18 min  Subjective Data  Subjective "I am glad I can now put weight on it."  Precautions  Precautions Fall  Required Braces or Orthoses Other Brace/Splint  Other Brace/Splint CAM boot when ambulating  Restrictions  Weight Bearing Restrictions Yes  RLE Weight Bearing WBAT (with CAM boot on )  Cognition  Arousal/Alertness Awake/alert  Behavior During Therapy WFL for tasks assessed/performed  Overall Cognitive Status Within Functional Limits for tasks assessed  Bed Mobility  Bed Mobility Not assessed  Transfers  Transfers Sit to Stand;Stand to Sit;Stand Pivot Transfers  Sit to Stand 4: Min guard;With upper extremity assist;With armrests;From chair/3-in-1  Stand to Sit 4: Min guard;With upper extremity assist;To chair/3-in-1;With armrests  Details for Transfer Assistance Called MD and weight bearing changed to WBAT with CAM boot.  Pt able to perform sit to stand with CAM boot with less assist.    Ambulation/Gait  Ambulation/Gait Assistance 4: Min assist  Ambulation Distance (Feet) 15 Feet  Assistive device Rolling walker  Ambulation/Gait Assistance Details Pt able to ambulate with CAM boot with cues for sequencing steps and RW.  Pt had one LOB needing min steadying assist.  Pt states, "That will not happen at home."  Educated pt that initially she should perform transfers only  and not ambulate long distances with Rw until she has practiced more with RW.  Pt agreed.    Gait Pattern Step-to pattern;Decreased stride length;Trunk flexed  Gait velocity decreased  Stairs No  Wheelchair Mobility  Wheelchair Mobility No  Static Standing Balance  Static Standing - Balance Support Bilateral upper extremity supported;During functional activity  Static Standing - Level of Assistance 5: Stand by assistance  Static Standing - Comment/# of Minutes Stood 1 min with RW with CAM boot with good stability statically.    PT - End of Session  Equipment Utilized During Treatment Gait belt  Activity Tolerance Patient limited by fatigue  Patient left in bed;with call bell/phone within reach  Nurse Communication Weight bearing status;Mobility status  PT - Assessment/Plan  PT Plan Current plan remains appropriate  PT Frequency Min 3X/week  Follow Up Recommendations Home health PT;Supervision/Assistance - 24 hour  PT equipment None recommended by PT  PT Goal Progression  Progress towards PT goals Progressing toward goals  PT General Charges  $$ ACUTE PT VISIT 1 Procedure  PT Treatments  $Gait Training 8-22 mins  [facility] [phone] (pager)

## 2013-06-26 NOTE — Progress Notes (Signed)
Introduced self to pt.  Denies pain or discomfort.  Instructed to call for assistance, with call bell at reach.  Husband at bedside.  Will continue to monitor.  Amanda Pea, Charity fundraiser.

## 2013-06-26 NOTE — Progress Notes (Signed)
All d/c instructions explained as given & verbalized understanding.  D/c to awaiting transport via w/c by assigned NT & husband escorted.  Amanda Pea, RN

## 2013-06-26 NOTE — Progress Notes (Signed)
Follow up on PT & OT for pt.Spoke with Durward Mallard, case manager & instructed that If ok with pt service can start in one week.  Pt Stated it was ok to start PT&OT next week.  Case manager stated Chestine Spore will provide service.  Amanda Pea, Charity fundraiser.

## 2013-06-26 NOTE — Progress Notes (Signed)
Orthopedic Tech Progress Note Patient Details:  Stephanie Brock 1941-07-19 [id number] Applied CAM walker to RLE. Ortho Devices Type of Ortho Device: CAM walker Ortho Device/Splint Location: RLE Ortho Device/Splint Interventions: Application   Lesle Chris 06/26/2013, 11:02 AM

## 2013-06-26 NOTE — Progress Notes (Signed)
Physical Therapy Evaluation Patient Details Name: Karolyn Messing MRN: [id number] DOB: 01/30/1941 Today's Date: 06/26/2013 Time: 0930-1003 PT Time Calculation (min): 33 min  PT Assessment / Plan / Recommendation History of Present Illness  Pt admit for right great toe partial resection.    Clinical Impression  Pt admitted with above.   Pt currently with functional limitations due to the deficits listed below (see PT Problem List). Pt wants to go home.  Couldn't maintain weight bearing therefore contacted MD Dr. Ralene Cork who stated with CAM boot pt can WBAT.  CAM boot not arrived yet therefore called ortho tech to bring the CAM boot and will return to see pt once boot in room.   Pt will benefit from skilled PT to increase their independence and safety with mobility to allow discharge to the venue listed below.   PT Assessment  Patient needs continued PT services    Follow Up Recommendations  SNF;Supervision/Assistance - 24 hour                Equipment Recommendations  None recommended by PT         Frequency Min 3X/week    Precautions / Restrictions Precautions Precautions: Fall Required Braces or Orthoses: Other Brace/Splint Other Brace/Splint: CAM boot when ambulating Restrictions Weight Bearing Restrictions: Yes RLE Weight Bearing: Non weight bearing (with CAM boot)   Pertinent Vitals/Pain VSS, No pain       Mobility  Bed Mobility Bed Mobility: Rolling Right;Right Sidelying to Sit;Sitting - Scoot to Edge of Bed Rolling Right: 4: Min guard;With rail Right Sidelying to Sit: 4: Min guard;With rails;HOB elevated Sitting - Scoot to Edge of Bed: 4: Min guard Details for Bed Mobility Assistance: cues and guard assist to get to EOB Transfers Transfers: Sit to Stand;Stand to Sit;Stand Pivot Transfers Sit to Stand: 1: +2 Total assist;With upper extremity assist;From bed Sit to Stand: Patient Percentage: 60% Stand to Sit: 1: +2 Total assist;With armrests;To chair/3-in-1;With  upper extremity assist Stand to Sit: Patient Percentage: 70% Stand Pivot Transfers: 1: +2 Total assist Stand Pivot Transfers: Patient Percentage: 60% Details for Transfer Assistance: Pt NWB per order therefore pt able to hold LE off floor but with LOB with pivot transfer needing steadying assist.   Ambulation/Gait Ambulation/Gait Assistance: 1: +2 Total assist Ambulation/Gait: Patient Percentage: 60% Ambulation Distance (Feet): 3 Feet Assistive device: Rolling walker Ambulation/Gait Assistance Details: Pt took pivotal steps with RW to the recliner.  Spoke with pt re: decr safety and risk for falls.  Pt adamant she can manage at home.  Obtained 3N1 without assist.  Pt unable to perform sit to stand and pivot to 3N1 maintaining NWB right LE.  Told pt PT would call MD to address weight bearing with MD.   Gait Pattern: Step-to pattern;Decreased stride length;Trunk flexed Gait velocity: decreased Stairs: No Wheelchair Mobility Wheelchair Mobility: No         PT Diagnosis: Generalized weakness  PT Problem List: Decreased activity tolerance;Decreased balance;Decreased mobility;Decreased knowledge of use of DME;Decreased safety awareness PT Treatment Interventions: DME instruction;Gait training;Functional mobility training;Therapeutic activities;Therapeutic exercise;Balance training;Patient/family education     PT Goals(Current goals can be found in the care plan section) Acute Rehab PT Goals Patient Stated Goal: to go home PT Goal Formulation: With patient Time For Goal Achievement: 07/10/13 Potential to Achieve Goals: Good  Visit Information  Last PT Received On: 06/26/13 Assistance Needed: +2 History of Present Illness: Pt admit for right great toe partial resection.  Prior Functioning  Home Living Family/patient expects to be discharged to:: Private residence Living Arrangements: Spouse/significant other Available Help at Discharge: Family;Available  PRN/intermittently Type of Home: House Home Access: Stairs to enter [employer] of Steps: 3 Entrance Stairs-Rails: None Home Layout: One level Home Equipment: Art gallery manager;Shower seat - built in (walk in shower; handicapped toilet) Prior Function Level of Independence: Independent Communication Communication: No difficulties    Cognition  Cognition Arousal/Alertness: Awake/alert Behavior During Therapy: WFL for tasks assessed/performed Overall Cognitive Status: Within Functional Limits for tasks assessed    Extremity/Trunk Assessment Upper Extremity Assessment Upper Extremity Assessment: Defer to OT evaluation Lower Extremity Assessment Lower Extremity Assessment: RLE deficits/detail;LLE deficits/detail RLE Deficits / Details: grossly 3/5  RLE: Unable to fully assess due to pain LLE Deficits / Details: grossly 3/5, lateral instability in ankle - has brace Cervical / Trunk Assessment Cervical / Trunk Assessment: Kyphotic   Balance Balance Balance Assessed: Yes Static Standing Balance Static Standing - Balance Support: Bilateral upper extremity supported;During functional activity Static Standing - Level of Assistance: 1: +2 Total assist (pt =60% ) Static Standing - Comment/# of Minutes: Pt stood 2 min to wipe self with LOB as she could not maintain NWB and stand with RW and wipe herself.    End of Session PT - End of Session Equipment Utilized During Treatment: Gait belt Activity Tolerance: Patient limited by fatigue Patient left: in chair;with call bell/phone within reach Nurse Communication: Mobility status      INGOLD,Laysha Childers 06/26/2013, 1:39 PM [facility] Acute Rehabilitation [phone] (pager)

## 2013-06-27 LAB — WOUND CULTURE: Gram Stain: NONE SEEN

## 2013-06-28 LAB — CULTURE, ROUTINE-ABSCESS

## 2013-06-30 LAB — ANAEROBIC CULTURE

## 2013-07-01 NOTE — Progress Notes (Signed)
07/01/2013 1145 Received call from [facility] requesting info on fax number for Dr. Gwenlyn Perking to sign initial orders for [name]. Provided fax number to agency.  Isidoro Donning RN CCM Case Mgmt phone [phone]

## 2013-07-02 ENCOUNTER — Ambulatory Visit (INDEPENDENT_AMBULATORY_CARE_PROVIDER_SITE_OTHER): Payer: Medicare Other

## 2013-07-02 ENCOUNTER — Ambulatory Visit: Payer: Medicare Other

## 2013-07-02 VITALS — BP 129/66 | HR 60 | Resp 18

## 2013-07-02 DIAGNOSIS — M79671 Pain in right foot: Secondary | ICD-10-CM

## 2013-07-02 DIAGNOSIS — L97509 Non-pressure chronic ulcer of other part of unspecified foot with unspecified severity: Secondary | ICD-10-CM

## 2013-07-02 DIAGNOSIS — E1040 Type 1 diabetes mellitus with diabetic neuropathy, unspecified: Secondary | ICD-10-CM

## 2013-07-02 DIAGNOSIS — Z09 Encounter for follow-up examination after completed treatment for conditions other than malignant neoplasm: Secondary | ICD-10-CM

## 2013-07-02 DIAGNOSIS — M79609 Pain in unspecified limb: Secondary | ICD-10-CM

## 2013-07-02 NOTE — Progress Notes (Signed)
  Subjective:    Patient ID: Stephanie Brock, female    DOB: [birth year], 72 y.o.   MRN: [id number] Called on Monday 06/24/13 and it was infected and was told to get to the ER and that Dr Ralene Cork had called and stated he would be there shortly and patient had the foot cleaned and had surgery on that Tuesday and was at 6pm and had an MRI done and patient had surgery HPI patient underwent debridement and amputation of right great toe joint and partial ray resection 8 days ago. Presents this time with air fracture boot her Cam Walker and dressings intact and dry. Minimal pain or discomfort. Patient is having some slight nausea or medications currently still taking antibiotic regimen as instructed.  Review of Systems deferred at this time     Objective:   Physical Exam Neurovascular status right foot as follows pedal pulses DP +2/4 PT pulse one over 4 Refill time 2 seconds all remaining digits. Dressings intact and dry on removal the incision appears to be well coapted except for a slight dehiscence last 1 cm distal portion of the incision. However there appears to be good pink granular base to that opening. There is no sign of dehiscence discharge or drainage. Just some dried blood in the dressings. No malodor no increased temperature. No ascending cellulitis or lymphangitis. Patient been having home nursing with therapy and exercise program as prescribed by her hospitalist.       Assessment & Plan:  Assessment this time is good postop progress x-rays revealed adequate resection of bone generalized osteopenic changes are noted consistent with preoperative evaluations as well. At this time dry sterile compressive dressing is applied after prepping with Betadine and Iodosorb. Will maintain the sterile gauze dressing for one more week. Reappointed one week for followup and dressing change. Encouraged to maintain her antibiotic regimen as instructed. Maintain only necessary walking and activities, keep foot  elevated whenever possible. She is to contact us is any changes in her status burning symptoms were to develop. Plan for suture removal likely at 2 weeks from now however may start dressing changes every other day after next week's visit. This may depend on the appearance of the wound site.  Alvan Dame DPM

## 2013-07-02 NOTE — Patient Instructions (Signed)

## 2013-07-09 ENCOUNTER — Ambulatory Visit (INDEPENDENT_AMBULATORY_CARE_PROVIDER_SITE_OTHER): Payer: Medicare Other

## 2013-07-09 VITALS — BP 125/61 | HR 59 | Temp 96.6°F | Resp 16

## 2013-07-09 DIAGNOSIS — Z09 Encounter for follow-up examination after completed treatment for conditions other than malignant neoplasm: Secondary | ICD-10-CM

## 2013-07-09 DIAGNOSIS — E1142 Type 2 diabetes mellitus with diabetic polyneuropathy: Secondary | ICD-10-CM

## 2013-07-09 DIAGNOSIS — E1040 Type 1 diabetes mellitus with diabetic neuropathy, unspecified: Secondary | ICD-10-CM

## 2013-07-09 DIAGNOSIS — L97509 Non-pressure chronic ulcer of other part of unspecified foot with unspecified severity: Secondary | ICD-10-CM

## 2013-07-09 DIAGNOSIS — E1049 Type 1 diabetes mellitus with other diabetic neurological complication: Secondary | ICD-10-CM

## 2013-07-09 NOTE — Progress Notes (Signed)
  Subjective:    Patient ID: Stephanie Brock, female    DOB: [birth year], 72 y.o.   MRN: [id number]  HPI my foot is better but i did feel some pain across my toes Patient presents this time just over 2 weeks post amputation first metatarsal and right great toe. Ambulating with walker and air fracture boot.   Review of Systems deferred at this time no new changes noted     Objective:   Physical Exam No changes in physical exam. Pedal pulses palpable, DP +2/4 right foot. +1 edema still noted. Capillary refill time 3 seconds all digits. Undressing removal sutures intact there is some slight dehiscence of the proximal 1.5 cm in the wound. Proxy 1.5 x 0.5 cm of wound dehisced proximally the distal wound is appears well coapted. The eschar and sutures are removed at this time. The wound is cleansed with saline and wound cleanser at this time Silvadene and a gauze dressing is applied. Patient is instructed to resume Silvadene and gauze dressings daily after cleansing with saline or soap and water on a daily basis. Patient is also instructed to maintain antibiotic regimen as instructed.       Assessment & Plan:  Assessment good postop progress patient is afebrile the wound has slight dehiscence of the proximal site. The majority the wound is well coapted and stable no increased temperature no discharge or drainage is noted. No malodor is noted. No ascending cellulitis or lymphangitis at this time. The dry eschar is debrided away and dressing reapplied patient is advised to maintain reduced activities. Will do dressing changes daily or every other day as instructed. Offer to have home nursing to met with dressing changes or patient indicates she would be able do those on her own. Patient does have refills for Silvadene already available to her we'll contact us if she has any problems or difficulties. Reappointed one week for further followup and reassessment monitor wound healing over the next several weeks on  a weekly basis.  Or to [facility]

## 2013-07-09 NOTE — Patient Instructions (Signed)
Instructions for Wound Care  The most important step to healing a foot wound is to reduce the pressure on your foot - it is extremely important to stay off your foot as much as possible and wear the shoe/boot as instructed.  Cleanse your foot with saline wash or warm soapy water (dial antibacterial soap or similar).  Blot dry.  Apply prescribed medication to your wound and cover with gauze and a bandage.  May hold bandage in place with Coban (self sticky wrap), Ace bandage or tape.  You may find dressing supplies at your local [employer], [employer], drug store or medical supply store.  Your prescribed topical medication is :  Silvadene Cream (twice daily) apply dressing changes with Silvadene and gauze dressing once daily or once every other day as instructed  [employer] is a mail order medical supply company that we use to provide some of our would care products.  If we use their service of you, you will receive the product by mail.  If you have not received the medication in 3 business days, please call our office.  If you notice any foul odor, increase in pain, pus, increased swelling, red streaks or generalized redness occurring in your foot or leg-Call our office immediately to be seen.  This may be a sign of a limb or life threatening infection that will need prompt attention.  Alvan Dame, [facility]  [facility]  [phone] [place]  [phone] [facility]

## 2013-07-16 ENCOUNTER — Encounter (INDEPENDENT_AMBULATORY_CARE_PROVIDER_SITE_OTHER): Payer: Self-pay

## 2013-07-16 ENCOUNTER — Ambulatory Visit (INDEPENDENT_AMBULATORY_CARE_PROVIDER_SITE_OTHER): Payer: Medicare Other

## 2013-07-16 VITALS — BP 120/61 | HR 60 | Resp 16

## 2013-07-16 DIAGNOSIS — E1040 Type 1 diabetes mellitus with diabetic neuropathy, unspecified: Secondary | ICD-10-CM

## 2013-07-16 DIAGNOSIS — Z09 Encounter for follow-up examination after completed treatment for conditions other than malignant neoplasm: Secondary | ICD-10-CM

## 2013-07-16 DIAGNOSIS — L97509 Non-pressure chronic ulcer of other part of unspecified foot with unspecified severity: Secondary | ICD-10-CM

## 2013-07-16 DIAGNOSIS — E1049 Type 1 diabetes mellitus with other diabetic neurological complication: Secondary | ICD-10-CM

## 2013-07-16 DIAGNOSIS — E1142 Type 2 diabetes mellitus with diabetic polyneuropathy: Secondary | ICD-10-CM

## 2013-07-16 NOTE — Progress Notes (Signed)
  Subjective:    Patient ID: Stephanie Brock, female    DOB: 1941/03/12, 72 y.o.   MRN: [id number]  HPI had surgery on 06/24/13 and the right foot is draining a little bit and hurts every now and then    Review of Systems deferred at this visit     Objective:   Physical Exam Patient still ambulating with the assistance of a walker, and fracture or cam boot. However she is weightbearing and more than recommended. Dressings intact and dry however patient cases there has been some clear serous drainage. Has been doing daily dressing changes soaking and Epson salts applying Silvadene gauze dressing. The wound is for the most part coapted however the proximalmost point has a proximal nail 1 cm x 1.5 cm dehiscence with a pink granular base. Does not probe down to bone or capsule. There is also small 5 mm dehiscence distally to the incision with minimal or no drainage. Temperature is normal no ascending cellulitis or lymphangitis. Patient still taking her antibiotic doxycycline as instructed twice a day. Will continue with wound care as instructed and maintain a cushioned padded dressing which is reapplied at this time.       Assessment & Plan:  Slow but steady progress status post partial ray resection first metatarsal right foot. Slight dehiscence noted however no ascending cellulitis or lymphangitis no secondary infection we'll continue to monitor in one week for followup plan for followup x-ray at that time. Stressed to continue to offload as much as possible no unnecessary walking or standing. Contact us with any changes or exacerbations occur at this time Iodosorb and gauze dressing was applied to help dry the wound. Reappointed one week for followup  Alvan Dame DPM

## 2013-07-16 NOTE — Patient Instructions (Signed)
Instructions for Wound Care  The most important step to healing a foot wound is to reduce the pressure on your foot - it is extremely important to stay off your foot as much as possible and wear the shoe/boot as instructed.  Cleanse your foot with saline wash or warm soapy water (dial antibacterial soap or similar).  Blot dry.  Apply prescribed medication to your wound and cover with gauze and a bandage.  May hold bandage in place with Coban (self sticky wrap), Ace bandage or tape.  You may find dressing supplies at your local [employer], Target, drug store or medical supply store.  Your prescribed topical medication is :  Silvadene Cream (twice daily) perform dressing changes once daily as instructed  [employer] is a mail order medical supply company that we use to provide some of our would care products.  If we use their service of you, you will receive the product by mail.  If you have not received the medication in 3 business days, please call our office.  If you notice any foul odor, increase in pain, pus, increased swelling, red streaks or generalized redness occurring in your foot or leg-Call our office immediately to be seen.  This may be a sign of a limb or life threatening infection that will need prompt attention.  Stephanie Brock, [facility]  [facility]  [phone] [place]  [phone] [facility]

## 2013-07-24 ENCOUNTER — Ambulatory Visit (INDEPENDENT_AMBULATORY_CARE_PROVIDER_SITE_OTHER): Payer: Medicare Other

## 2013-07-24 ENCOUNTER — Encounter (INDEPENDENT_AMBULATORY_CARE_PROVIDER_SITE_OTHER): Payer: Self-pay

## 2013-07-24 VITALS — BP 121/62 | HR 62 | Resp 18

## 2013-07-24 DIAGNOSIS — E1142 Type 2 diabetes mellitus with diabetic polyneuropathy: Secondary | ICD-10-CM

## 2013-07-24 DIAGNOSIS — L97509 Non-pressure chronic ulcer of other part of unspecified foot with unspecified severity: Secondary | ICD-10-CM

## 2013-07-24 DIAGNOSIS — Z09 Encounter for follow-up examination after completed treatment for conditions other than malignant neoplasm: Secondary | ICD-10-CM

## 2013-07-24 DIAGNOSIS — E1040 Type 1 diabetes mellitus with diabetic neuropathy, unspecified: Secondary | ICD-10-CM

## 2013-07-24 DIAGNOSIS — E1049 Type 1 diabetes mellitus with other diabetic neurological complication: Secondary | ICD-10-CM

## 2013-07-24 DIAGNOSIS — M79609 Pain in unspecified limb: Secondary | ICD-10-CM

## 2013-07-24 DIAGNOSIS — Q828 Other specified congenital malformations of skin: Secondary | ICD-10-CM

## 2013-07-24 NOTE — Patient Instructions (Signed)

## 2013-07-24 NOTE — Progress Notes (Signed)
  Subjective:    Patient ID: Stephanie Brock, female    DOB: Jan 24, 1941, 72 y.o.   MRN: [id number] My right foot is better and some draining but not alot HPI Patient is status post partial first ray resection and great toe amputation right foot doing well at this time dressings intact and dry and healing with a cam boot and walker for assistance. Patient also new problem has a keratoses sub-fifth metatarsal area left foot which is debridement at this time.   Review of Systems  All other systems reviewed and are negative.   no changes or new findings noted at this time    Objective:   Physical Exam Vascular status is intact DP +2 for PT one over 4 bilateral absent hair growth in the skin texture and turgor profound neuropathy with absent sensation to the forefoot midfoot your foot bilateral. Right foot is status post surgical amputation the dressings intact and dry with mild serous drainage still noted. The ulcer is down to approximately less than a centimeter in diameter the distal ulcer site is avulsed completely closed down to dermal level only the proximal size proxy now 1 cm x 0.7 cm in diameter. It started to epithelialize with mild serous drainage and a pink granular base  The contralateral or left foot does have a hemorrhage a keratosis sub-fifth MTP area metatarsal left foot there is no active discharge or drainage no signs of infection patient does have diabetes with peripheral neuropathy and angiopathy history of ulcerations multiple episodes in the past       Assessment & Plan:  Assessments as follows #1 good postop progress following indication of right great toe presto compressive dressing reapplied contain Silvadene and gauze dressing daily cleansing with antibacterial soap and warm water daily as instructed recheck in 2 weeks for further followup maintain cam boot as instructed may discontinue use of the walker [place] was will suggest using a cane for balance and assistance with  gait will bring shoes with her at next visit for possible evaluation and adjustments to accommodate the deformity and postoperative changes.  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 Continue with accommodative palliative care in the future as needed no other new changes medication her health history of current time. Followup is any changes or exacerbations were to occur otherwise a 2 week followup for continued postop evaluation and management the postoperative dehiscence/ulcer site is steadily improving and closing  Alvan Dame DPM

## 2013-08-07 ENCOUNTER — Ambulatory Visit (INDEPENDENT_AMBULATORY_CARE_PROVIDER_SITE_OTHER): Payer: Medicare Other

## 2013-08-07 VITALS — BP 114/58 | HR 62 | Resp 18

## 2013-08-07 DIAGNOSIS — E1049 Type 1 diabetes mellitus with other diabetic neurological complication: Secondary | ICD-10-CM

## 2013-08-07 DIAGNOSIS — L97509 Non-pressure chronic ulcer of other part of unspecified foot with unspecified severity: Secondary | ICD-10-CM

## 2013-08-07 DIAGNOSIS — Z09 Encounter for follow-up examination after completed treatment for conditions other than malignant neoplasm: Secondary | ICD-10-CM

## 2013-08-07 DIAGNOSIS — E1040 Type 1 diabetes mellitus with diabetic neuropathy, unspecified: Secondary | ICD-10-CM

## 2013-08-07 DIAGNOSIS — E1142 Type 2 diabetes mellitus with diabetic polyneuropathy: Secondary | ICD-10-CM

## 2013-08-07 NOTE — Patient Instructions (Signed)
ANTIBACTERIAL SOAP INSTRUCTIONS  THE DAY AFTER PROCEDURE  Please follow the instructions your doctor has marked.   Shower as usual. Before getting out, place a drop of antibacterial liquid soap (Dial) on a wet, clean washcloth.  Gently wipe washcloth over affected area.  Afterward, rinse the area with warm water.  Blot the area dry with a soft cloth and cover with antibiotic ointment (neosporin, polysporin, bacitracin) and band aid or gauze and tape  Place 3-4 drops of antibacterial liquid soap in a quart of warm tap water.  Submerge foot into water for 20 minutes.  If bandage was applied after your procedure, leave on to allow for easy lift off, then remove and continue with soak for the remaining time.  Next, blot area dry with a soft cloth and cover with a bandage.  Apply other medications as directed by your doctor, such as cortisporin otic solution (eardrops) or neosporin antibiotic ointment  Recommend Silvadene and a Band-Aid or gauze dressing daily to the area of irritation

## 2013-08-07 NOTE — Progress Notes (Signed)
   Subjective:    Patient ID: Stephanie Brock, female    DOB: September 03, 1941, 72 y.o.   MRN: [id number]  HPI my right foot is fine and left foot is swollen some    Review of Systems no changes at this visit     Objective:   Physical Exam Neurovascular status is intact patient is doing well status post amputation of right great toe. No changes in neurovascular status pedal pulses are palpable. Neurologically profound neuropathy is still noted. Patient is just over 6 weeks status post the amputation. The wound appears to be well coapted there is only one small area of dehiscence on the medial incision right foot. Recommend continued Silvadene and gauze dressing were Band-Aid dressing. At this time we'll discontinue use of air fracture boot patient will go back and her diabetic shoes and Plastizote diabetic insoles. The shoes fit and contour well as to the insoles with adequate space for the foot. Patient also keratoses sub-fifth metatarsal area left foot which shows no active ulceration no discharge or drainage the keratotic lesions re\re debrided       Assessment & Plan:  Good postop progress following amputation right great toe and first metatarsal head the incision is healing well with slow but steady healing a small area of dehiscence still present within half centimeter ulceration down to subcutaneous tissue level. Will continue with Silvadene and gauze dressing recheck in one month for followup plan for followup x-ray that time discontinue boot and return to accommodative stretch diabetic shoes  Alvan Dame DPM

## 2013-08-14 ENCOUNTER — Ambulatory Visit (INDEPENDENT_AMBULATORY_CARE_PROVIDER_SITE_OTHER): Payer: Medicare Other

## 2013-08-14 VITALS — BP 129/64 | HR 65 | Temp 96.7°F | Resp 16

## 2013-08-14 DIAGNOSIS — E1040 Type 1 diabetes mellitus with diabetic neuropathy, unspecified: Secondary | ICD-10-CM

## 2013-08-14 DIAGNOSIS — L02419 Cutaneous abscess of limb, unspecified: Secondary | ICD-10-CM

## 2013-08-14 DIAGNOSIS — E1049 Type 1 diabetes mellitus with other diabetic neurological complication: Secondary | ICD-10-CM

## 2013-08-14 DIAGNOSIS — M79609 Pain in unspecified limb: Secondary | ICD-10-CM

## 2013-08-14 DIAGNOSIS — L03115 Cellulitis of right lower limb: Secondary | ICD-10-CM

## 2013-08-14 DIAGNOSIS — Z09 Encounter for follow-up examination after completed treatment for conditions other than malignant neoplasm: Secondary | ICD-10-CM

## 2013-08-14 MED ORDER — DOXYCYCLINE HYCLATE 100 MG PO TABS: 100.0000 mg | ORAL_TABLET | Freq: Two times a day (BID) | ORAL | Status: AC

## 2013-08-14 NOTE — Patient Instructions (Signed)

## 2013-08-14 NOTE — Progress Notes (Signed)
   Subjective:    Patient ID: Stephanie Brock, female    DOB: Mar 03, 1941, 72 y.o.   MRN: [id number]  HPI swelling in big toe and real red and glossy and started yesterday and some chills and burns and throbs and sore and tender on my right foot Patient has some increased edema and redness of her right leg and foot the foot incision site appears to be well slight dehiscence at the proximal most point still open about 3-4 mm in total diameter. There is some slight serous drainage from that area however more significant about 5-6 inches above the medial malleolus patient scheduled area of excoriation with a superficial ulceration in appear to be like blistering of skin with clear serous discharge drainage localized cellulitis. Mid leg   Review of Systems no changes. Patient had some slight chills also some night sweats and last couple days indicates her blood sugars have been well managed.     Objective:   Physical Exam Neurovascular status is unchanged pedal pulses are palpable patient is approximately 8 weeks status post amputation right great toe joint. The incision is well coapted a small area the proximal incision has a 2-3 mm dehiscence still present no purulent discharge drainage no malodor noted. The foot is warm to touch however her lower leg mid leg is extremely warm to touch with area of erythema and weeping discharge or drainage mid leg consistent with a localized cellulitis and superficial blistering. Left leg is unremarkable. This of them going on for approximately 24 hours better today than it was yesterday. Patient is afebrile however has had some chills no ascending cellulitis is noted localized to mid leg.       Assessment & Plan:  Silvadene and gauze dressing applied to the area blistering cellulitis mid leg as well as the first MTP joint area. Maintain Silvadene gauze dressings daily soaking cleanse with your Epsom salts in warm water or warm soapy water and reapplied dressings  daily reappointed for followup. Prescription will be called in for doxycycline to resume her antibiotic regimen. Contact us immediately or emergency room if fever spikes or reaches 100 or more.  Alvan Dame DPM

## 2013-08-21 ENCOUNTER — Ambulatory Visit (INDEPENDENT_AMBULATORY_CARE_PROVIDER_SITE_OTHER): Payer: Medicare Other

## 2013-08-21 VITALS — BP 113/60 | HR 57 | Resp 18

## 2013-08-21 DIAGNOSIS — Z09 Encounter for follow-up examination after completed treatment for conditions other than malignant neoplasm: Secondary | ICD-10-CM

## 2013-08-21 DIAGNOSIS — E1049 Type 1 diabetes mellitus with other diabetic neurological complication: Secondary | ICD-10-CM

## 2013-08-21 DIAGNOSIS — E1040 Type 1 diabetes mellitus with diabetic neuropathy, unspecified: Secondary | ICD-10-CM

## 2013-08-21 DIAGNOSIS — L03115 Cellulitis of right lower limb: Secondary | ICD-10-CM

## 2013-08-21 DIAGNOSIS — Z79899 Other long term (current) drug therapy: Secondary | ICD-10-CM

## 2013-08-21 DIAGNOSIS — L02419 Cutaneous abscess of limb, unspecified: Secondary | ICD-10-CM

## 2013-08-21 NOTE — Progress Notes (Signed)
   Subjective:    Patient ID: Stephanie Brock, female    DOB: 04/19/1941, 72 y.o.   MRN: [id number]  HPI I had surgery on my right foot and it is doing good continues to have some significant plus one posterior edema the right leg with swelling in the calf and leg foot and ankle. There is a superficial weeping ulceration of the medial malleoli are area of the right ankle. The surgical wound sites are well healed and coapted in stable although there is slight edema the toes no open wounds ulcerations noted on the foot or operative sites.    Review of Systems no new changes or findings     Objective:   Physical Exam Neurovascular status intact and unchanged does have peripheral neuropathy pedal pulses palpable but diminished the dehiscence of the of the incision site is resolved there is no discharge drainage and the incision area however there still some ulceration proximal to the medial malleolus right ankle with venous stasis type ulceration and weeping still present. Redness and cellulitis has reduced of the patient advised to maintain her antibiotic regimen as a refill on current antibiotics and will be maintained after another week or 2.       Assessment & Plan:  Assessment good postop progress following an irritation of right great toe dehiscence is resolved however continues to have cellulitis of the right leg and venous stasis dermatitis with weeping ulceration. Silvadene and gauze dressing applied to the ulcer site and leg recheck in 2 weeks for followup might consider utilizing the boot if she continues to have swelling with no response at this time advised to keep the foot elevated as much as possible using consider compression stockings as an option. Recheck in 2 weeks for further followup and ulcer check the nails in her third fourth and fifth toes are debrided at this time on the right foot as they're somewhat and hypertrophic and criptotic and causing some discomfort  Alvan Dame  DPM

## 2013-08-21 NOTE — Patient Instructions (Signed)
Stasis Ulcer Stasis ulcers occur in the legs when the circulation is damaged. An ulcer may look like a small hole in the skin.  CAUSES Stasis ulcers occur because your veins do not work properly. Veins have valves that help the blood return to the heart. If these valves do not work right, blood flows backwards and backs up into the veins near the skin. This condition causes the veins to become larger because of increased pressure and may lead to a stasis ulcer. SYMPTOMS   Shallow (superficial) sore on the leg.  Clear drainage or weeping from the sore.  Leg pain or a feeling of heaviness. This may be worse at the end of the day.  Leg swelling.  Skin color changes. DIAGNOSIS  Your caregiver will make a diagnosis by examining your leg. Your caregiver may order tests such as an ultrasound or other studies to evaluate the blood flow of the leg. HOME CARE INSTRUCTIONS   Do not stand or sit in one position for long periods of time. Do not sit with your legs crossed. Rest with your legs raised during the day. If possible, it is best if you can elevate your legs above your heart for 30 minutes, 3 to 4 times a day.  Wear elastic stockings or support hose. Do not wear other tight encircling garments around legs, pelvis, or waist. This causes increased pressure in your veins. If your caregiver has applied compressive medicated wraps, use them as instructed.  Walk as much as possible to increase blood flow. If you are taking long rides in a car or plane, take a break to walk around every 2 hours. If not already on aspirin, take a baby aspirin before long trips unless you have medical reasons that prohibit this.  Raise the foot of your bed at night with 2-inch blocks if approved by your caregiver. This may not be desirable if you have heart failure or breathing problems.  If you get a cut in the skin over the vein and the vein bleeds, lie down with your leg raised and gently clean the area with a clean  cloth. Apply pressure on the cut until the bleeding stops. Then place a dressing on the cut. See your caregiver if it continues to bleed or needs stitches. Also, see your caregiver if you develop an infection.Signs of an infection include a fever, redness, increased pain, and drainage of pus.  If your caregiver has given you a follow-up appointment, it is very important to keep that appointment. Not keeping the appointment could result in a chronic or permanent injury, pain, and disability. If there is any problem keeping the appointment, call your caregiver for assistance. SEEK IMMEDIATE MEDICAL CARE IF:  The ulcer area starts to break down.  You have pain, redness, tenderness, pus, or hard swelling in your leg over a vein or near the ulcer.  Your leg pain is uncomfortable.  You develop an unexplained fever.  You develop chest pain or shortness of breath. Document Released: 05/16/2001 Document Revised: 11/13/2011 Document Reviewed: 12/11/2010 [facility] Patient Information 2014 [place], [place]. Edema Edema is an abnormal build-up of fluids in tissues. Because this is partly dependent on gravity (water flows to the lowest place), it is more common in the legs and thighs (lower extremities). It is also common in the looser tissues, like around the eyes. Painless swelling of the feet and ankles is common and increases as a person ages. It may affect both legs and may include the calves  or even thighs. When squeezed, the fluid may move out of the affected area and may leave a dent for a few moments. CAUSES   Prolonged standing or sitting in one place for extended periods of time. Movement helps pump tissue fluid into the veins, and absence of movement prevents this, resulting in edema.  Varicose veins. The valves in the veins do not work as well as they should. This causes fluid to leak into the tissues.  Fluid and salt overload.  Injury, burn, or surgery to the leg, ankle, or foot, may damage  veins and allow fluid to leak out.  Sunburn damages vessels. Leaky vessels allow fluid to go out into the sunburned tissues.  Allergies (from insect bites or stings, medications or chemicals) cause swelling by allowing vessels to become leaky.  Protein in the blood helps keep fluid in your vessels. Low protein, as in malnutrition, allows fluid to leak out.  Hormonal changes, including pregnancy and menstruation, cause fluid retention. This fluid may leak out of vessels and cause edema.  Medications that cause fluid retention. Examples are sex hormones, blood pressure medications, steroid treatment, or anti-depressants.  Some illnesses cause edema, especially heart failure, kidney disease, or liver disease.  Surgery that cuts veins or lymph nodes, such as surgery done for the heart or for breast cancer, may result in edema. DIAGNOSIS  Your caregiver is usually easily able to determine what is causing your swelling (edema) by simply asking what is wrong (getting a history) and examining you (doing a physical). Sometimes x-rays, EKG (electrocardiogram or heart tracing), and blood work may be done to evaluate for underlying medical illness. TREATMENT  General treatment includes:  Leg elevation (or elevation of the affected body part).  Restriction of fluid intake.  Prevention of fluid overload.  Compression of the affected body part. Compression with elastic bandages or support stockings squeezes the tissues, preventing fluid from entering and forcing it back into the blood vessels.  Diuretics (also called water pills or fluid pills) pull fluid out of your body in the form of increased urination. These are effective in reducing the swelling, but can have side effects and must be used only under your caregiver's supervision. Diuretics are appropriate only for some types of edema. The specific treatment can be directed at any underlying causes discovered. Heart, liver, or kidney disease should  be treated appropriately. HOME CARE INSTRUCTIONS   Elevate the legs (or affected body part) above the level of the heart, while lying down.  Avoid sitting or standing still for prolonged periods of time.  Avoid putting anything directly under the knees when lying down, and do not wear constricting clothing or garters on the upper legs.  Exercising the legs causes the fluid to work back into the veins and lymphatic channels. This may help the swelling go down.  The pressure applied by elastic bandages or support stockings can help reduce ankle swelling.  A low-salt diet may help reduce fluid retention and decrease the ankle swelling.  Take any medications exactly as prescribed. SEEK MEDICAL CARE IF:  Your edema is not responding to recommended treatments. SEEK IMMEDIATE MEDICAL CARE IF:   You develop shortness of breath or chest pain.  You cannot breathe when you lay down; or if, while lying down, you have to get up and go to the window to get your breath.  You are having increasing swelling without relief from treatment.  You develop a fever over 102 F (38.9 C).  You develop  pain or redness in the areas that are swollen.  Tell your caregiver right away if you have gained 03 lb/1.4 kg in 1 day or 05 lb/2.3 kg in a week. MAKE SURE YOU:   Understand these instructions.  Will watch your condition.  Will get help right away if you are not doing well or get worse. Document Released: 08/21/2005 Document Revised: 02/20/2012 Document Reviewed: 04/08/2008 [facility] Patient Information 2014 [place], [place].

## 2013-09-03 ENCOUNTER — Ambulatory Visit (INDEPENDENT_AMBULATORY_CARE_PROVIDER_SITE_OTHER): Payer: Medicare Other

## 2013-09-03 VITALS — BP 107/52 | HR 65 | Resp 18

## 2013-09-03 DIAGNOSIS — L03115 Cellulitis of right lower limb: Secondary | ICD-10-CM

## 2013-09-03 DIAGNOSIS — Z09 Encounter for follow-up examination after completed treatment for conditions other than malignant neoplasm: Secondary | ICD-10-CM

## 2013-09-03 DIAGNOSIS — R609 Edema, unspecified: Secondary | ICD-10-CM

## 2013-09-03 DIAGNOSIS — L02419 Cutaneous abscess of limb, unspecified: Secondary | ICD-10-CM

## 2013-09-03 DIAGNOSIS — E1049 Type 1 diabetes mellitus with other diabetic neurological complication: Secondary | ICD-10-CM

## 2013-09-03 DIAGNOSIS — E1142 Type 2 diabetes mellitus with diabetic polyneuropathy: Secondary | ICD-10-CM

## 2013-09-03 DIAGNOSIS — I83009 Varicose veins of unspecified lower extremity with ulcer of unspecified site: Secondary | ICD-10-CM

## 2013-09-03 DIAGNOSIS — IMO0001 Reserved for inherently not codable concepts without codable children: Secondary | ICD-10-CM

## 2013-09-03 DIAGNOSIS — E1040 Type 1 diabetes mellitus with diabetic neuropathy, unspecified: Secondary | ICD-10-CM

## 2013-09-03 NOTE — Progress Notes (Signed)
   Subjective:    Patient ID: Stephanie Brock, female    DOB: 1941-08-01, 72 y.o.   MRN: [id number]  HPI my foot is doing good and my right leg is hurting some    Review of Systems  HENT: Negative.   Respiratory: Negative.   Cardiovascular: Negative.   Gastrointestinal: Negative.   Endocrine: Negative.   Genitourinary: Negative.   Musculoskeletal: Positive for gait problem.  Skin: Positive for wound.  Allergic/Immunologic: Negative.   Neurological: Positive for weakness and numbness.  Hematological: Negative.   Psychiatric/Behavioral: Negative.   All other systems reviewed and are negative.   no new changes or findings     Objective:   Physical Exam Neurovascular status is unchanged patient does have status post surgical intervention for indication right great toe the wound site is well-healed no discharge or drainage no pain in the wound site however patient continues to have cellulitis the right leg with weepy discharge drainage consistent with venous stasis ulceration medial and anterior ankle and above the ankle area on the right leg. There is mild serous drainage slight erythema no ascending cellulitis or lymphangitis noted patient is afebrile. Left foot is unremarkable at this time. Again unrelated to the surgical operative site patient is venous stasis with plus one the posterior pitting and weeping ulcerations above the right ankle area is cleansed with all cleansed at this time Silvadene and Unna boot wrap are applied to the right leg for 7 days duration      Assessment & Plan:  Assessment good postop progress following indication right great toe with as well coapted.  Assessment number to is venous stasis dermatitis and ulceration right mid leg above the ankle. At this time some localized edema and erythema and weeping drainage noted by to continue with doxycycline antibiotic that she is currently taking. At this time Silvadene and Unna boot soft cast wrap is applied to the  right leg below the knee. The cast extended from below the knee to the MTP joint. Patient is placed in a Darco shoe for ambulation will be recheck in one week for followup and Unna boot change. Contact us any changes or exacerbations occur in the interim.  Alvan Dame DPM

## 2013-09-03 NOTE — Patient Instructions (Signed)
Stasis Ulcer °Stasis ulcers occur in the legs when the circulation is damaged. An ulcer may look like a small hole in the skin.  °CAUSES °Stasis ulcers occur because your veins do not work properly. Veins have valves that help the blood return to the heart. If these valves do not work right, blood flows backwards and backs up into the veins near the skin. This condition causes the veins to become larger because of increased pressure and may lead to a stasis ulcer. °SYMPTOMS  °· Shallow (superficial) sore on the leg. °· Clear drainage or weeping from the sore. °· Leg pain or a feeling of heaviness. This may be worse at the end of the day. °· Leg swelling. °· Skin color changes. °DIAGNOSIS  °Your caregiver will make a diagnosis by examining your leg. Your caregiver may order tests such as an ultrasound or other studies to evaluate the blood flow of the leg. °HOME CARE INSTRUCTIONS  °· Do not stand or sit in one position for long periods of time. Do not sit with your legs crossed. Rest with your legs raised during the day. If possible, it is best if you can elevate your legs above your heart for 30 minutes, 3 to 4 times a day. °· Wear elastic stockings or support hose. Do not wear other tight encircling garments around legs, pelvis, or waist. This causes increased pressure in your veins. If your caregiver has applied compressive medicated wraps, use them as instructed. °· Walk as much as possible to increase blood flow. If you are taking long rides in a car or plane, take a break to walk around every 2 hours. If not already on aspirin, take a baby aspirin before long trips unless you have medical reasons that prohibit this. °· Raise the foot of your bed at night with 2-inch blocks if approved by your caregiver. This may not be desirable if you have heart failure or breathing problems. °· If you get a cut in the skin over the vein and the vein bleeds, lie down with your leg raised and gently clean the area with a clean  cloth. Apply pressure on the cut until the bleeding stops. Then place a dressing on the cut. See your caregiver if it continues to bleed or needs stitches. Also, see your caregiver if you develop an infection. Signs of an infection include a fever, redness, increased pain, and drainage of pus. °· If your caregiver has given you a follow-up appointment, it is very important to keep that appointment. Not keeping the appointment could result in a chronic or permanent injury, pain, and disability. If there is any problem keeping the appointment, call your caregiver for assistance. °SEEK IMMEDIATE MEDICAL CARE IF: °· The ulcer area starts to break down. °· You have pain, redness, tenderness, pus, or hard swelling in your leg over a vein or near the ulcer. °· Your leg pain is uncomfortable. °· You develop an unexplained fever. °· You develop chest pain or shortness of breath. °Document Released: 05/16/2001 Document Revised: 11/13/2011 Document Reviewed: 12/11/2010 °[employer]® Patient Information ©2014 [employer]. ° °

## 2013-09-10 ENCOUNTER — Ambulatory Visit: Payer: Medicare Other

## 2013-09-10 ENCOUNTER — Ambulatory Visit (INDEPENDENT_AMBULATORY_CARE_PROVIDER_SITE_OTHER): Payer: Medicare Other

## 2013-09-10 VITALS — BP 125/63 | HR 55 | Resp 18

## 2013-09-10 DIAGNOSIS — Z09 Encounter for follow-up examination after completed treatment for conditions other than malignant neoplasm: Secondary | ICD-10-CM

## 2013-09-10 DIAGNOSIS — I83009 Varicose veins of unspecified lower extremity with ulcer of unspecified site: Secondary | ICD-10-CM

## 2013-09-10 DIAGNOSIS — L02419 Cutaneous abscess of limb, unspecified: Secondary | ICD-10-CM

## 2013-09-10 DIAGNOSIS — IMO0001 Reserved for inherently not codable concepts without codable children: Secondary | ICD-10-CM

## 2013-09-10 DIAGNOSIS — L03119 Cellulitis of unspecified part of limb: Secondary | ICD-10-CM

## 2013-09-10 DIAGNOSIS — L03115 Cellulitis of right lower limb: Secondary | ICD-10-CM

## 2013-09-10 DIAGNOSIS — R609 Edema, unspecified: Secondary | ICD-10-CM

## 2013-09-10 DIAGNOSIS — L97909 Non-pressure chronic ulcer of unspecified part of unspecified lower leg with unspecified severity: Secondary | ICD-10-CM

## 2013-09-10 NOTE — Patient Instructions (Signed)
Edema Edema is an abnormal build-up of fluids in tissues. Because this is partly dependent on gravity (water flows to the lowest place), it is more common in the legs and thighs (lower extremities). It is also common in the looser tissues, like around the eyes. Painless swelling of the feet and ankles is common and increases as a person ages. It may affect both legs and may include the calves or even thighs. When squeezed, the fluid may move out of the affected area and may leave a dent for a few moments. CAUSES   Prolonged standing or sitting in one place for extended periods of time. Movement helps pump tissue fluid into the veins, and absence of movement prevents this, resulting in edema.  Varicose veins. The valves in the veins do not work as well as they should. This causes fluid to leak into the tissues.  Fluid and salt overload.  Injury, burn, or surgery to the leg, ankle, or foot, may damage veins and allow fluid to leak out.  Sunburn damages vessels. Leaky vessels allow fluid to go out into the sunburned tissues.  Allergies (from insect bites or stings, medications or chemicals) cause swelling by allowing vessels to become leaky.  Protein in the blood helps keep fluid in your vessels. Low protein, as in malnutrition, allows fluid to leak out.  Hormonal changes, including pregnancy and menstruation, cause fluid retention. This fluid may leak out of vessels and cause edema.  Medications that cause fluid retention. Examples are sex hormones, blood pressure medications, steroid treatment, or anti-depressants.  Some illnesses cause edema, especially heart failure, kidney disease, or liver disease.  Surgery that cuts veins or lymph nodes, such as surgery done for the heart or for breast cancer, may result in edema. DIAGNOSIS  Your caregiver is usually easily able to determine what is causing your swelling (edema) by simply asking what is wrong (getting a history) and examining you (doing  a physical). Sometimes x-rays, EKG (electrocardiogram or heart tracing), and blood work may be done to evaluate for underlying medical illness. TREATMENT  General treatment includes:  Leg elevation (or elevation of the affected body part).  Restriction of fluid intake.  Prevention of fluid overload.  Compression of the affected body part. Compression with elastic bandages or support stockings squeezes the tissues, preventing fluid from entering and forcing it back into the blood vessels.  Diuretics (also called water pills or fluid pills) pull fluid out of your body in the form of increased urination. These are effective in reducing the swelling, but can have side effects and must be used only under your caregiver's supervision. Diuretics are appropriate only for some types of edema. The specific treatment can be directed at any underlying causes discovered. Heart, liver, or kidney disease should be treated appropriately. HOME CARE INSTRUCTIONS   Elevate the legs (or affected body part) above the level of the heart, while lying down.  Avoid sitting or standing still for prolonged periods of time.  Avoid putting anything directly under the knees when lying down, and do not wear constricting clothing or garters on the upper legs.  Exercising the legs causes the fluid to work back into the veins and lymphatic channels. This may help the swelling go down.  The pressure applied by elastic bandages or support stockings can help reduce ankle swelling.  A low-salt diet may help reduce fluid retention and decrease the ankle swelling.  Take any medications exactly as prescribed. SEEK MEDICAL CARE IF:  Your edema is   not responding to recommended treatments. SEEK IMMEDIATE MEDICAL CARE IF:   You develop shortness of breath or chest pain.  You cannot breathe when you lay down; or if, while lying down, you have to get up and go to the window to get your breath.  You are having increasing  swelling without relief from treatment.  You develop a fever over 102 F (38.9 C).  You develop pain or redness in the areas that are swollen.  Tell your caregiver right away if you have gained 03 lb/1.4 kg in 1 day or 05 lb/2.3 kg in a week. MAKE SURE YOU:   Understand these instructions.  Will watch your condition.  Will get help right away if you are not doing well or get worse. Document Released: 08/21/2005 Document Revised: 02/20/2012 Document Reviewed: 04/08/2008 [employer] Patient Information 2014 [employer].  

## 2013-09-10 NOTE — Progress Notes (Signed)
   Subjective:    Patient ID: Stephanie Brock, female    DOB: 06-Oct-1940, 73 y.o.   MRN: [id number]  HPI I am good on my right foot and it still hurts on my right leg     Review of Systems no new changes or findings     Objective:   Physical Exam Lower extremity right side appears to be improving the edema is gone down significantly incision from the surgical site on the right forefoot first ray amputation is completely healed no dehiscence no discharge or drainage noted. However there is still some areas of ulceration and skin superficial skin excoriation above the right ankle medial ankle a medial shin area of the right leg. Patient also still is having some tenderness in that area still taking doxycycline as instructed Unaboot was removed and a new boot wrap was reapplied to the right leg should note that the discharge or drainage is greatly reduced minimal active drainage which note was noted at the present time previously was draining considered significantly with weeping ulcerations of the medial right ankle.       Assessment & Plan:  Assessment this time significant venous insufficiency and edema greatly improved with the boot wrap there is almost complete loss or reduction of weeping and ulceration very superficial at this time and the anterior and medial right ankle and shin area. Patient will boot wrap was reapplied at this time maintain Darco shoe reappointed one week patient will complete her doxycycline regimen as instructed contact us visiting changes or exacerbations. Patient any fever or chills no other systemic signs noted the ulceration is improving and this is stasis the wound site is also stable no dehiscence no discharge or drainage noted  [name] DPM

## 2013-09-15 NOTE — Progress Notes (Signed)
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

## 2013-09-17 ENCOUNTER — Ambulatory Visit: Payer: Medicare Other

## 2013-09-18 ENCOUNTER — Ambulatory Visit (INDEPENDENT_AMBULATORY_CARE_PROVIDER_SITE_OTHER): Payer: Medicare Other

## 2013-09-18 VITALS — BP 131/64 | HR 82 | Resp 16

## 2013-09-18 DIAGNOSIS — E1049 Type 1 diabetes mellitus with other diabetic neurological complication: Secondary | ICD-10-CM

## 2013-09-18 DIAGNOSIS — E1040 Type 1 diabetes mellitus with diabetic neuropathy, unspecified: Secondary | ICD-10-CM

## 2013-09-18 DIAGNOSIS — M79609 Pain in unspecified limb: Secondary | ICD-10-CM

## 2013-09-18 DIAGNOSIS — R609 Edema, unspecified: Secondary | ICD-10-CM

## 2013-09-18 DIAGNOSIS — Z09 Encounter for follow-up examination after completed treatment for conditions other than malignant neoplasm: Secondary | ICD-10-CM

## 2013-09-18 NOTE — Progress Notes (Signed)
   Subjective:    Patient ID: Stephanie Brock, female    DOB: 1941/06/04, 73 y.o.   MRN: [id number]  HPI it is doing good on my right foot and my leg keeps on bothering me and my left foot across the top of my foot is red     Review of Systems no changes or new findings     Objective:   Physical Exam Vascular status is intact pedal pulses are palpable in DP plus over 4 PT plus one over 4 bilateral there edema is greatly reduced with use the Unna boot on the right leg. It is removed there is still some areas of irritation did recommend using topical Silvadene and a gauze pad for couple days may resume normal bathing and hygiene on the right leg. Left leg anterior ankle area as the oval-shaped erythematous ringlike lesion possibly consistent with a fungal infection almost ringworm like in appearance it was aggravated when she tried using Silvadene on this dispensing Naftin cream to apply daily to the affected area with a light gauze wrap. Cannot rule out an irritation from a folded socks or her AFO brace. At this time the dressings are removed may resume normal bathing and hygiene soap and water and again Silvadene and the right leg Naftin on the left leg lesions recheck in 2-3 weeks for reevaluation maintain appropriate accommodative shoe gear as instructed the postoperative site on the right foot status post first ray amputation is completely healed and resolved no discharge or drainage no signs of infection no open wounds are noted.       Assessment & Plan:  Good postop progress right foot. Patient saw resolving venous stasis ulceration right leg. Discontinue the boot at this time. Slight area of ringworm possible fungal infection anterior left ankle treated with Naftin 2% cream samples are dispensed for patient use. Patient are to have Silvadene home recheck in 3 weeks for further followup and reevaluation maintain normal diabetic footcare and hygiene protocols.  [name] DPM

## 2013-09-18 NOTE — Patient Instructions (Signed)
ANTIBACTERIAL SOAP INSTRUCTIONS  THE DAY AFTER PROCEDURE  Please follow the instructions your doctor has marked.   Shower as usual. Before getting out, place a drop of antibacterial liquid soap (Dial) on a wet, clean washcloth.  Gently wipe washcloth over affected area.  Afterward, rinse the area with warm water.  Blot the area dry with a soft cloth and cover with antibiotic ointment (neosporin, polysporin, bacitracin) and band aid or gauze and tape Place 3-4 drops of antibacterial liquid soap in a quart of warm tap water.  Submerge foot into water for 20 minutes.  If bandage was applied after your procedure, leave on to allow for easy lift off, then remove and continue with soak for the remaining time.  Next, blot area dry with a soft cloth and cover with a bandage.  Apply other medications as directed by your doctor, such as cortisporin otic solution (eardrops) or neosporin antibiotic ointment use Silvadene ointment to the areas of irritation on the right leg use Naftin cream on the anterior left ankle area until these wounds resolve.   Diabetes and Foot Care Diabetes may cause you to have problems because of poor blood supply (circulation) to your feet and legs. This may cause the skin on your feet to become thinner, break easier, and heal more slowly. Your skin may become dry, and the skin may peel and crack. You may also have nerve damage in your legs and feet causing decreased feeling in them. You may not notice minor injuries to your feet that could lead to infections or more serious problems. Taking care of your feet is one of the most important things you can do for yourself.  HOME CARE INSTRUCTIONS  Wear shoes at all times, even in the house. Do not go barefoot. Bare feet are easily injured.  Check your feet daily for blisters, cuts, and redness. If you cannot see the bottom of your feet, use a mirror or ask someone for help.  Wash your feet with warm water (do not use hot water) and  mild soap. Then pat your feet and the areas between your toes until they are completely dry. Do not soak your feet as this can dry your skin.  Apply a moisturizing lotion or petroleum jelly (that does not contain alcohol and is unscented) to the skin on your feet and to dry, brittle toenails. Do not apply lotion between your toes.  Trim your toenails straight across. Do not dig under them or around the cuticle. File the edges of your nails with an emery board or nail file.  Do not cut corns or calluses or try to remove them with medicine.  Wear clean socks or stockings every day. Make sure they are not too tight. Do not wear knee-high stockings since they may decrease blood flow to your legs.  Wear shoes that fit properly and have enough cushioning. To break in new shoes, wear them for just a few hours a day. This prevents you from injuring your feet. Always look in your shoes before you put them on to be sure there are no objects inside.  Do not cross your legs. This may decrease the blood flow to your feet.  If you find a minor scrape, cut, or break in the skin on your feet, keep it and the skin around it clean and dry. These areas may be cleansed with mild soap and water. Do not cleanse the area with peroxide, alcohol, or iodine.  When you remove an adhesive  bandage, be sure not to damage the skin around it.  If you have a wound, look at it several times a day to make sure it is healing.  Do not use heating pads or hot water bottles. They may burn your skin. If you have lost feeling in your feet or legs, you may not know it is happening until it is too late.  Make sure your health care provider performs a complete foot exam at least annually or more often if you have foot problems. Report any cuts, sores, or bruises to your health care provider immediately. SEEK MEDICAL CARE IF:   You have an injury that is not healing.  You have cuts or breaks in the skin.  You have an ingrown  nail.  You notice redness on your legs or feet.  You feel burning or tingling in your legs or feet.  You have pain or cramps in your legs and feet.  Your legs or feet are numb.  Your feet always feel cold. SEEK IMMEDIATE MEDICAL CARE IF:   There is increasing redness, swelling, or pain in or around a wound.  There is a red line that goes up your leg.  Pus is coming from a wound.  You develop a fever or as directed by your health care provider.  You notice a bad smell coming from an ulcer or wound. Document Released: 08/18/2000 Document Revised: 04/23/2013 Document Reviewed: 01/28/2013 [facility] Patient Information 2014 [place].

## 2013-10-09 ENCOUNTER — Ambulatory Visit (INDEPENDENT_AMBULATORY_CARE_PROVIDER_SITE_OTHER): Payer: Medicare Other

## 2013-10-09 VITALS — BP 122/63 | HR 59 | Resp 18

## 2013-10-09 DIAGNOSIS — E1049 Type 1 diabetes mellitus with other diabetic neurological complication: Secondary | ICD-10-CM

## 2013-10-09 DIAGNOSIS — E1149 Type 2 diabetes mellitus with other diabetic neurological complication: Secondary | ICD-10-CM

## 2013-10-09 DIAGNOSIS — B351 Tinea unguium: Secondary | ICD-10-CM

## 2013-10-09 DIAGNOSIS — E1140 Type 2 diabetes mellitus with diabetic neuropathy, unspecified: Secondary | ICD-10-CM

## 2013-10-09 DIAGNOSIS — E1142 Type 2 diabetes mellitus with diabetic polyneuropathy: Secondary | ICD-10-CM

## 2013-10-09 DIAGNOSIS — E1040 Type 1 diabetes mellitus with diabetic neuropathy, unspecified: Secondary | ICD-10-CM

## 2013-10-09 NOTE — Progress Notes (Signed)
   Subjective:    Patient ID: Stephanie Brock, female    DOB: 1940-11-04, 73 y.o.   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 Capillary fill time 3 seconds all digits the incision site from the surgery were amputation first and second toe right foot is taken place is completely healed no open wounds ulcerations noted the ulceration of her right shin and ankle area has resolved slight erythema present minimal eschar noted however Silvadene and light gauze dressing still applied today likely discontinue with the next week. Patient also has some irritation to the anterior ankle area left with has a dressing in place patient case at all areas of redness in her feet legs improved when she was on Levaquin as result of the antibiotic regimen. Patient's nails thick criptotic incurvated and friable x3 on the right x5 on the left.      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 Suggest a 6 week followup for surgery check and ulcer check for any recurrence did suggest she contact her primary physician who is handling the MRSA infection and consider an additional round of Levaquin and possible some nasal  swabs to rule out but patient does not contain to carry her MRSA infection. She will do this in followup with [place] in 6 weeks as recommended  [name] DPM

## 2013-10-09 NOTE — Patient Instructions (Signed)
Diabetes and Foot Care Diabetes may cause you to have problems because of poor blood supply (circulation) to your feet and legs. This may cause the skin on your feet to become thinner, break easier, and heal more slowly. Your skin may become dry, and the skin may peel and crack. You may also have nerve damage in your legs and feet causing decreased feeling in them. You may not notice minor injuries to your feet that could lead to infections or more serious problems. Taking care of your feet is one of the most important things you can do for yourself.  HOME CARE INSTRUCTIONS  Wear shoes at all times, even in the house. Do not go barefoot. Bare feet are easily injured.  Check your feet daily for blisters, cuts, and redness. If you cannot see the bottom of your feet, use a mirror or ask someone for help.  Wash your feet with warm water (do not use hot water) and mild soap. Then pat your feet and the areas between your toes until they are completely dry. Do not soak your feet as this can dry your skin.  Apply a moisturizing lotion or petroleum jelly (that does not contain alcohol and is unscented) to the skin on your feet and to dry, brittle toenails. Do not apply lotion between your toes.  Trim your toenails straight across. Do not dig under them or around the cuticle. File the edges of your nails with an emery board or nail file.  Do not cut corns or calluses or try to remove them with medicine.  Wear clean socks or stockings every day. Make sure they are not too tight. Do not wear knee-high stockings since they may decrease blood flow to your legs.  Wear shoes that fit properly and have enough cushioning. To break in new shoes, wear them for just a few hours a day. This prevents you from injuring your feet. Always look in your shoes before you put them on to be sure there are no objects inside.  Do not cross your legs. This may decrease the blood flow to your feet.  If you find a minor scrape,  cut, or break in the skin on your feet, keep it and the skin around it clean and dry. These areas may be cleansed with mild soap and water. Do not cleanse the area with peroxide, alcohol, or iodine.  When you remove an adhesive bandage, be sure not to damage the skin around it.  If you have a wound, look at it several times a day to make sure it is healing.  Do not use heating pads or hot water bottles. They may burn your skin. If you have lost feeling in your feet or legs, you may not know it is happening until it is too late.  Make sure your health care provider performs a complete foot exam at least annually or more often if you have foot problems. Report any cuts, sores, or bruises to your health care provider immediately. SEEK MEDICAL CARE IF:   You have an injury that is not healing.  You have cuts or breaks in the skin.  You have an ingrown nail.  You notice redness on your legs or feet.  You feel burning or tingling in your legs or feet.  You have pain or cramps in your legs and feet.  Your legs or feet are numb.  Your feet always feel cold. SEEK IMMEDIATE MEDICAL CARE IF:   There is increasing redness,   swelling, or pain in or around a wound.  There is a red line that goes up your leg.  Pus is coming from a wound.  You develop a fever or as directed by your health care provider.  You notice a bad smell coming from an ulcer or wound. Document Released: 08/18/2000 Document Revised: 04/23/2013 Document Reviewed: 01/28/2013 [employer] Patient Information 2014 [employer].  

## 2013-11-20 ENCOUNTER — Ambulatory Visit: Payer: Medicare Other

## 2013-11-26 ENCOUNTER — Ambulatory Visit (INDEPENDENT_AMBULATORY_CARE_PROVIDER_SITE_OTHER): Payer: Medicare Other

## 2013-11-26 VITALS — BP 126/61 | HR 65 | Resp 18

## 2013-11-26 DIAGNOSIS — Q828 Other specified congenital malformations of skin: Secondary | ICD-10-CM

## 2013-11-26 DIAGNOSIS — E1040 Type 1 diabetes mellitus with diabetic neuropathy, unspecified: Secondary | ICD-10-CM

## 2013-11-26 DIAGNOSIS — E1140 Type 2 diabetes mellitus with diabetic neuropathy, unspecified: Secondary | ICD-10-CM

## 2013-11-26 NOTE — Progress Notes (Signed)
   Subjective:    Patient ID: Stephanie Brock, female    DOB: June 02, 1941, 73 y.o.   MRN: [id number]  HPI They are doing good and the leg is healed and there is a place on my left foot that I have a bandaid on but that is just to keep it from rubbing my shoe    Review of Systems no new changes or findings at this visit    Objective:   Physical Exam Neurovascular status is intact pedal pulses palpable patient is status post amputation of the right great toe and second digit patient does have a healed incision site on the right foot the left foot where she had some breakdown skin and rash which could be either been a tinea or possibly superficial infection or vasculitis is resolved dislocation and area on her right hand her left hand which is been biopsied by her primary physician and she was placed back on antibiotic ointment regimen for that no other new problems or new changes open wounds ulcerations patient diabetes with peripheral neuropathy. Be stable however does have significant arthropathy with deformities of all digits one through 5 left 2 through 5 right will continue monitor these again so the pain is likely systems a diabetic neuropathy versus arthropathy.       Assessment & Plan:  Cuboid that within the next one month for diabetic foot and palliative nail care as needed contact us in changes or exacerbations in the interim. Maintain appropriate diabetic shoes and insoles as instructed at all times.  [name] DPM

## 2013-11-26 NOTE — Patient Instructions (Signed)
Diabetes and Foot Care Diabetes may cause you to have problems because of poor blood supply (circulation) to your feet and legs. This may cause the skin on your feet to become thinner, break easier, and heal more slowly. Your skin may become dry, and the skin may peel and crack. You may also have nerve damage in your legs and feet causing decreased feeling in them. You may not notice minor injuries to your feet that could lead to infections or more serious problems. Taking care of your feet is one of the most important things you can do for yourself.  HOME CARE INSTRUCTIONS  Wear shoes at all times, even in the house. Do not go barefoot. Bare feet are easily injured.  Check your feet daily for blisters, cuts, and redness. If you cannot see the bottom of your feet, use a mirror or ask someone for help.  Wash your feet with warm water (do not use hot water) and mild soap. Then pat your feet and the areas between your toes until they are completely dry. Do not soak your feet as this can dry your skin.  Apply a moisturizing lotion or petroleum jelly (that does not contain alcohol and is unscented) to the skin on your feet and to dry, brittle toenails. Do not apply lotion between your toes.  Trim your toenails straight across. Do not dig under them or around the cuticle. File the edges of your nails with an emery board or nail file.  Do not cut corns or calluses or try to remove them with medicine.  Wear clean socks or stockings every day. Make sure they are not too tight. Do not wear knee-high stockings since they may decrease blood flow to your legs.  Wear shoes that fit properly and have enough cushioning. To break in new shoes, wear them for just a few hours a day. This prevents you from injuring your feet. Always look in your shoes before you put them on to be sure there are no objects inside.  Do not cross your legs. This may decrease the blood flow to your feet.  If you find a minor scrape,  cut, or break in the skin on your feet, keep it and the skin around it clean and dry. These areas may be cleansed with mild soap and water. Do not cleanse the area with peroxide, alcohol, or iodine.  When you remove an adhesive bandage, be sure not to damage the skin around it.  If you have a wound, look at it several times a day to make sure it is healing.  Do not use heating pads or hot water bottles. They may burn your skin. If you have lost feeling in your feet or legs, you may not know it is happening until it is too late.  Make sure your health care provider performs a complete foot exam at least annually or more often if you have foot problems. Report any cuts, sores, or bruises to your health care provider immediately. SEEK MEDICAL CARE IF:   You have an injury that is not healing.  You have cuts or breaks in the skin.  You have an ingrown nail.  You notice redness on your legs or feet.  You feel burning or tingling in your legs or feet.  You have pain or cramps in your legs and feet.  Your legs or feet are numb.  Your feet always feel cold. SEEK IMMEDIATE MEDICAL CARE IF:   There is increasing redness,   swelling, or pain in or around a wound.  There is a red line that goes up your leg.  Pus is coming from a wound.  You develop a fever or as directed by your health care provider.  You notice a bad smell coming from an ulcer or wound. Document Released: 08/18/2000 Document Revised: 04/23/2013 Document Reviewed: 01/28/2013 [employer] Patient Information 2014 [employer].  

## 2013-12-24 ENCOUNTER — Ambulatory Visit (INDEPENDENT_AMBULATORY_CARE_PROVIDER_SITE_OTHER): Payer: Medicare Other

## 2013-12-24 VITALS — BP 126/55 | HR 61 | Resp 18

## 2013-12-24 DIAGNOSIS — B351 Tinea unguium: Secondary | ICD-10-CM

## 2013-12-24 DIAGNOSIS — E1040 Type 1 diabetes mellitus with diabetic neuropathy, unspecified: Secondary | ICD-10-CM

## 2013-12-24 DIAGNOSIS — M79609 Pain in unspecified limb: Secondary | ICD-10-CM

## 2013-12-24 NOTE — Progress Notes (Signed)
   Subjective:    Patient ID: Stephanie Brock, female    DOB: [birth year], 73 y.o.   MRN: [id number]  HPI I need my toenails trimmed and check my right foot seems to be a little tender    Review of Systems no new systemic changes or findings that     Objective:   Physical Exam Lower extremity objective findings as follows vascular status is intact pedal pulses palpable DP +2/4 PT one over 4 bilateral capillary refill timed diminished on Semmes Weinstein diminished to 45 seconds bilateral nares patient is status post amputation hallux and second toe right foot is were AFO brace to assist with gait ambulation is severe arthropathy deformities of toes are remaining toes. Patient does have capillary refill time 4 seconds all digits neurologically skin color pigment normal hair growth absent mild varicosities noted mild + edema noted patient does have nails thick brittle criptotic incurvated friable nails x7 debridement this time. No open wounds ulcerations well coapted healed incisions on the right foot. Patient ambulating appropriate diabetic extra-depth shoes an AFO brace as instructed. Again patient had complications of diabetic neuropathy although examined with a cane with assistance does have function motor function of both lower extremities has arthrosis for some restricted motion of the digits and ankle however and ice comfortably has decreased sensory input on palpation and Semmes Weinstein testing to the toes plantar foot and arch has absent sensation intact sensation lower legs some hyperpigmentation carried changes secondary to venous insufficiency and stasis both lower extremity is noted as well.       Assessment & Plan:  Assessment this time diabetes with complicating factors history of neuropathy and angiopathy partial amputation at this time thick dystrophic brittle crumbly friable mycotic nails 1 through 5 bilateral pressure 1 through 5 left 34 and 5 right total of 8 nails are debrided  this time return for future palliative care on an as-needed basis suggest a 2 month followup for palliative care patient did request ice answer some questions regarding her renal for her driver's license she was hospitalized sometime in the past he it over your ago for partial application of right foot toes one into secondary diabetes and complications with peripheral neuropathy otherwise is having stable stably in shoes and diabetic shoes an AFO device motor function is relatively normal he intact however there is sensory loss the should not significant fracture driving ability for the most part. Patient is advised majority the paperwork needs to be filled out by her primary physician.  [name] DPM

## 2013-12-24 NOTE — Patient Instructions (Signed)
Diabetes and Foot Care Diabetes may cause you to have problems because of poor blood supply (circulation) to your feet and legs. This may cause the skin on your feet to become thinner, break easier, and heal more slowly. Your skin may become dry, and the skin may peel and crack. You may also have nerve damage in your legs and feet causing decreased feeling in them. You may not notice minor injuries to your feet that could lead to infections or more serious problems. Taking care of your feet is one of the most important things you can do for yourself.  HOME CARE INSTRUCTIONS  Wear shoes at all times, even in the house. Do not go barefoot. Bare feet are easily injured.  Check your feet daily for blisters, cuts, and redness. If you cannot see the bottom of your feet, use a mirror or ask someone for help.  Wash your feet with warm water (do not use hot water) and mild soap. Then pat your feet and the areas between your toes until they are completely dry. Do not soak your feet as this can dry your skin.  Apply a moisturizing lotion or petroleum jelly (that does not contain alcohol and is unscented) to the skin on your feet and to dry, brittle toenails. Do not apply lotion between your toes.  Trim your toenails straight across. Do not dig under them or around the cuticle. File the edges of your nails with an emery board or nail file.  Do not cut corns or calluses or try to remove them with medicine.  Wear clean socks or stockings every day. Make sure they are not too tight. Do not wear knee-high stockings since they may decrease blood flow to your legs.  Wear shoes that fit properly and have enough cushioning. To break in new shoes, wear them for just a few hours a day. This prevents you from injuring your feet. Always look in your shoes before you put them on to be sure there are no objects inside.  Do not cross your legs. This may decrease the blood flow to your feet.  If you find a minor scrape,  cut, or break in the skin on your feet, keep it and the skin around it clean and dry. These areas may be cleansed with mild soap and water. Do not cleanse the area with peroxide, alcohol, or iodine.  When you remove an adhesive bandage, be sure not to damage the skin around it.  If you have a wound, look at it several times a day to make sure it is healing.  Do not use heating pads or hot water bottles. They may burn your skin. If you have lost feeling in your feet or legs, you may not know it is happening until it is too late.  Make sure your health care provider performs a complete foot exam at least annually or more often if you have foot problems. Report any cuts, sores, or bruises to your health care provider immediately. SEEK MEDICAL CARE IF:   You have an injury that is not healing.  You have cuts or breaks in the skin.  You have an ingrown nail.  You notice redness on your legs or feet.  You feel burning or tingling in your legs or feet.  You have pain or cramps in your legs and feet.  Your legs or feet are numb.  Your feet always feel cold. SEEK IMMEDIATE MEDICAL CARE IF:   There is increasing redness,   swelling, or pain in or around a wound.  There is a red line that goes up your leg.  Pus is coming from a wound.  You develop a fever or as directed by your health care provider.  You notice a bad smell coming from an ulcer or wound. Document Released: 08/18/2000 Document Revised: 04/23/2013 Document Reviewed: 01/28/2013 [employer] Patient Information 2014 [employer].  

## 2014-02-25 ENCOUNTER — Ambulatory Visit: Payer: Medicare Other

## 2014-03-04 ENCOUNTER — Ambulatory Visit: Payer: Medicare Other

## 2014-03-16 ENCOUNTER — Ambulatory Visit: Payer: Medicare Other

## 2014-03-19 ENCOUNTER — Ambulatory Visit (INDEPENDENT_AMBULATORY_CARE_PROVIDER_SITE_OTHER): Payer: Medicare Other

## 2014-03-19 VITALS — BP 110/59 | HR 74 | Resp 18

## 2014-03-19 DIAGNOSIS — E1040 Type 1 diabetes mellitus with diabetic neuropathy, unspecified: Secondary | ICD-10-CM

## 2014-03-19 DIAGNOSIS — M79606 Pain in leg, unspecified: Secondary | ICD-10-CM

## 2014-03-19 DIAGNOSIS — M79609 Pain in unspecified limb: Secondary | ICD-10-CM

## 2014-03-19 DIAGNOSIS — E1049 Type 1 diabetes mellitus with other diabetic neurological complication: Secondary | ICD-10-CM

## 2014-03-19 DIAGNOSIS — Q828 Other specified congenital malformations of skin: Secondary | ICD-10-CM

## 2014-03-19 DIAGNOSIS — B351 Tinea unguium: Secondary | ICD-10-CM

## 2014-03-19 NOTE — Patient Instructions (Signed)
Diabetes and Foot Care Diabetes may cause you to have problems because of poor blood supply (circulation) to your feet and legs. This may cause the skin on your feet to become thinner, break easier, and heal more slowly. Your skin may become dry, and the skin may peel and crack. You may also have nerve damage in your legs and feet causing decreased feeling in them. You may not notice minor injuries to your feet that could lead to infections or more serious problems. Taking care of your feet is one of the most important things you can do for yourself.  HOME CARE INSTRUCTIONS  Wear shoes at all times, even in the house. Do not go barefoot. Bare feet are easily injured.  Check your feet daily for blisters, cuts, and redness. If you cannot see the bottom of your feet, use a mirror or ask someone for help.  Wash your feet with warm water (do not use hot water) and mild soap. Then pat your feet and the areas between your toes until they are completely dry. Do not soak your feet as this can dry your skin.  Apply a moisturizing lotion or petroleum jelly (that does not contain alcohol and is unscented) to the skin on your feet and to dry, brittle toenails. Do not apply lotion between your toes.  Trim your toenails straight across. Do not dig under them or around the cuticle. File the edges of your nails with an emery board or nail file.  Do not cut corns or calluses or try to remove them with medicine.  Wear clean socks or stockings every day. Make sure they are not too tight. Do not wear knee-high stockings since they may decrease blood flow to your legs.  Wear shoes that fit properly and have enough cushioning. To break in new shoes, wear them for just a few hours a day. This prevents you from injuring your feet. Always look in your shoes before you put them on to be sure there are no objects inside.  Do not cross your legs. This may decrease the blood flow to your feet.  If you find a minor scrape,  cut, or break in the skin on your feet, keep it and the skin around it clean and dry. These areas may be cleansed with mild soap and water. Do not cleanse the area with peroxide, alcohol, or iodine.  When you remove an adhesive bandage, be sure not to damage the skin around it.  If you have a wound, look at it several times a day to make sure it is healing.  Do not use heating pads or hot water bottles. They may burn your skin. If you have lost feeling in your feet or legs, you may not know it is happening until it is too late.  Make sure your health care provider performs a complete foot exam at least annually or more often if you have foot problems. Report any cuts, sores, or bruises to your health care provider immediately. SEEK MEDICAL CARE IF:   You have an injury that is not healing.  You have cuts or breaks in the skin.  You have an ingrown nail.  You notice redness on your legs or feet.  You feel burning or tingling in your legs or feet.  You have pain or cramps in your legs and feet.  Your legs or feet are numb.  Your feet always feel cold. SEEK IMMEDIATE MEDICAL CARE IF:   There is increasing redness,   swelling, or pain in or around a wound.  There is a red line that goes up your leg.  Pus is coming from a wound.  You develop a fever or as directed by your health care provider.  You notice a bad smell coming from an ulcer or wound. Document Released: 08/18/2000 Document Revised: 04/23/2013 Document Reviewed: 01/28/2013 [employer] Patient Information 2015 [employer]. This information is not intended to replace advice given to you by your health care provider. Make sure you discuss any questions you have with your health care provider.  

## 2014-03-19 NOTE — Progress Notes (Signed)
   Subjective:    Patient ID: Stephanie Brock, female    DOB: [birth year], 73 y.o.   MRN: [id number]  HPI  CHECK MY FEET AND I NEED A TRIM OF MY NAILS    Review of Systems no new findings or systemic changes noted hours patient since last here has been in the hospital with cardiac complications currently is wearing a Holter monitor. Patient apparently had some episodes and was hospitalized on vacation in [place]. Was told she may be a pacemaker     Objective:   Physical Exam Arlyn Dunning objective findings as follows vascular status is intact and unchanged DP +2/4 bilateral PT one over 4 bilateral capillary refill time 3 seconds all digits epicritic sensations intact although diminished on toes and Semmes Weinstein testing. Patient's nails thick criptotic incurvated friable 3 through 5 on the right one through 5 on the left there is no open wounds ulcerations no secondary infections thick brittle crumbly friable mycotic nails noted and 7 also at this time keratoses sub-fifth MTP area left is identified no open wound or ulceration. Patient maintaining the AFO brace and appropriate diabetic shoes and custom insoles for ambulation to       Assessment & Plan:  Assessment diabetes with history peripheral neuropathy. Patient is also being evaluated for cardiac abnormality at this current time. Thick brittle coming from a mycotic nails treated x7 and the presence of pain in symptomology as well as diabetes and complications also keratotic lesion plantar left fifth metatarsal area is debrided maintain appropriate diabetic shoes and future followup in 2-3 months for continued palliative care is needed next  [name] DPM

## 2014-05-20 ENCOUNTER — Ambulatory Visit (INDEPENDENT_AMBULATORY_CARE_PROVIDER_SITE_OTHER): Payer: Medicare Other

## 2014-05-20 VITALS — BP 145/60 | HR 82 | Resp 18

## 2014-05-20 DIAGNOSIS — B351 Tinea unguium: Secondary | ICD-10-CM

## 2014-05-20 DIAGNOSIS — E1040 Type 1 diabetes mellitus with diabetic neuropathy, unspecified: Secondary | ICD-10-CM

## 2014-05-20 DIAGNOSIS — M79609 Pain in unspecified limb: Secondary | ICD-10-CM

## 2014-05-20 DIAGNOSIS — Q828 Other specified congenital malformations of skin: Secondary | ICD-10-CM

## 2014-05-20 DIAGNOSIS — M79606 Pain in leg, unspecified: Secondary | ICD-10-CM

## 2014-05-20 DIAGNOSIS — E1049 Type 1 diabetes mellitus with other diabetic neurological complication: Secondary | ICD-10-CM

## 2014-05-20 NOTE — Progress Notes (Signed)
   Subjective:    Patient ID: Stephanie Brock, female    DOB: 17-Feb-1941, 73 y.o.   MRN: [id number]  HPI I AM HERE TO HAVE MY TOENAILS TRIMMED AND ALSO I HAVE A CALLUS ON THE BALL OF MY LEFT FOOT AND I HAVE A PLACE ON MY LEFT LEG THAT HAS CAME UP    Review of Systems no new findings or systemic changes noted     Objective:   Physical Exam Neurovascular status is unchanged patient continues to have hemostasis type hyperpigmentation both lower legs and ankles with shiny skin and trophic skin changes also pre-ulcer medial malleoli are area of the left ankle showing some slight maceration of no drainage is noted suggested keeping some Silvadene and gauze pad on this area as a precaution. Patient has pedal pulses palpable with thready DP postal for PT one over 4 Refill time 3 seconds skin temperature warm to cool turgor diminished mild edema noted nails thick brittle crumbly dystrophic and friable x8 and he had amputated first and second toes right there is a hemorrhage a keratoses lateral fifth metatarsal area of the left plantar foot due to contracture the toe and prominence the fifth met no open wounds or ulcers no secondary infections again profound neuropathy with absence of epicritic and proprioceptive sensations bilateral feet history of multiple ulcers and complications. Patient recently has had additional strokes may be getting a pacemaker in the near future in case her heart rate down to the 20 times.       Assessment & Plan:  Assessment this time his diabetes history peripheral neuropathy and angiopathy again keratoses sub-5 left is debrided multiple thick brittle friable mycotic nails 1 through 5 left 34 and 5 right are debrided at this time recommended future palliative care every 2-3 months contain diabetic shoes and custom insoles an AFO bracing patient otherwise seems to be stable will likely have some treatment for her repetitive strokes and posse pacemaker placement near future.  Followup for palliative nail care in 2-3 months  [name] DPM

## 2014-05-20 NOTE — Patient Instructions (Signed)
Diabetes and Foot Care Diabetes may cause you to have problems because of poor blood supply (circulation) to your feet and legs. This may cause the skin on your feet to become thinner, break easier, and heal more slowly. Your skin may become dry, and the skin may peel and crack. You may also have nerve damage in your legs and feet causing decreased feeling in them. You may not notice minor injuries to your feet that could lead to infections or more serious problems. Taking care of your feet is one of the most important things you can do for yourself.  HOME CARE INSTRUCTIONS  Wear shoes at all times, even in the house. Do not go barefoot. Bare feet are easily injured.  Check your feet daily for blisters, cuts, and redness. If you cannot see the bottom of your feet, use a mirror or ask someone for help.  Wash your feet with warm water (do not use hot water) and mild soap. Then pat your feet and the areas between your toes until they are completely dry. Do not soak your feet as this can dry your skin.  Apply a moisturizing lotion or petroleum jelly (that does not contain alcohol and is unscented) to the skin on your feet and to dry, brittle toenails. Do not apply lotion between your toes.  Trim your toenails straight across. Do not dig under them or around the cuticle. File the edges of your nails with an emery board or nail file.  Do not cut corns or calluses or try to remove them with medicine.  Wear clean socks or stockings every day. Make sure they are not too tight. Do not wear knee-high stockings since they may decrease blood flow to your legs.  Wear shoes that fit properly and have enough cushioning. To break in new shoes, wear them for just a few hours a day. This prevents you from injuring your feet. Always look in your shoes before you put them on to be sure there are no objects inside.  Do not cross your legs. This may decrease the blood flow to your feet.  If you find a minor scrape,  cut, or break in the skin on your feet, keep it and the skin around it clean and dry. These areas may be cleansed with mild soap and water. Do not cleanse the area with peroxide, alcohol, or iodine.  When you remove an adhesive bandage, be sure not to damage the skin around it.  If you have a wound, look at it several times a day to make sure it is healing.  Do not use heating pads or hot water bottles. They may burn your skin. If you have lost feeling in your feet or legs, you may not know it is happening until it is too late.  Make sure your health care provider performs a complete foot exam at least annually or more often if you have foot problems. Report any cuts, sores, or bruises to your health care provider immediately. SEEK MEDICAL CARE IF:   You have an injury that is not healing.  You have cuts or breaks in the skin.  You have an ingrown nail.  You notice redness on your legs or feet.  You feel burning or tingling in your legs or feet.  You have pain or cramps in your legs and feet.  Your legs or feet are numb.  Your feet always feel cold. SEEK IMMEDIATE MEDICAL CARE IF:   There is increasing redness,   swelling, or pain in or around a wound.  There is a red line that goes up your leg.  Pus is coming from a wound.  You develop a fever or as directed by your health care provider.  You notice a bad smell coming from an ulcer or wound. Document Released: 08/18/2000 Document Revised: 04/23/2013 Document Reviewed: 01/28/2013 [employer] Patient Information 2015 [employer]. This information is not intended to replace advice given to you by your health care provider. Make sure you discuss any questions you have with your health care provider.  

## 2014-05-21 ENCOUNTER — Ambulatory Visit: Payer: Medicare Other

## 2014-07-20 ENCOUNTER — Ambulatory Visit (INDEPENDENT_AMBULATORY_CARE_PROVIDER_SITE_OTHER): Payer: Medicare Other

## 2014-07-20 DIAGNOSIS — Q828 Other specified congenital malformations of skin: Secondary | ICD-10-CM

## 2014-07-20 DIAGNOSIS — B351 Tinea unguium: Secondary | ICD-10-CM

## 2014-07-20 DIAGNOSIS — M79606 Pain in leg, unspecified: Secondary | ICD-10-CM

## 2014-07-20 DIAGNOSIS — E1040 Type 1 diabetes mellitus with diabetic neuropathy, unspecified: Secondary | ICD-10-CM

## 2014-07-20 NOTE — Patient Instructions (Signed)
Diabetes and Foot Care Diabetes may cause you to have problems because of poor blood supply (circulation) to your feet and legs. This may cause the skin on your feet to become thinner, break easier, and heal more slowly. Your skin may become dry, and the skin may peel and crack. You may also have nerve damage in your legs and feet causing decreased feeling in them. You may not notice minor injuries to your feet that could lead to infections or more serious problems. Taking care of your feet is one of the most important things you can do for yourself.  HOME CARE INSTRUCTIONS  Wear shoes at all times, even in the house. Do not go barefoot. Bare feet are easily injured.  Check your feet daily for blisters, cuts, and redness. If you cannot see the bottom of your feet, use a mirror or ask someone for help.  Wash your feet with warm water (do not use hot water) and mild soap. Then pat your feet and the areas between your toes until they are completely dry. Do not soak your feet as this can dry your skin.  Apply a moisturizing lotion or petroleum jelly (that does not contain alcohol and is unscented) to the skin on your feet and to dry, brittle toenails. Do not apply lotion between your toes.  Trim your toenails straight across. Do not dig under them or around the cuticle. File the edges of your nails with an emery board or nail file.  Do not cut corns or calluses or try to remove them with medicine.  Wear clean socks or stockings every day. Make sure they are not too tight. Do not wear knee-high stockings since they may decrease blood flow to your legs.  Wear shoes that fit properly and have enough cushioning. To break in new shoes, wear them for just a few hours a day. This prevents you from injuring your feet. Always look in your shoes before you put them on to be sure there are no objects inside.  Do not cross your legs. This may decrease the blood flow to your feet.  If you find a minor scrape,  cut, or break in the skin on your feet, keep it and the skin around it clean and dry. These areas may be cleansed with mild soap and water. Do not cleanse the area with peroxide, alcohol, or iodine.  When you remove an adhesive bandage, be sure not to damage the skin around it.  If you have a wound, look at it several times a day to make sure it is healing.  Do not use heating pads or hot water bottles. They may burn your skin. If you have lost feeling in your feet or legs, you may not know it is happening until it is too late.  Make sure your health care provider performs a complete foot exam at least annually or more often if you have foot problems. Report any cuts, sores, or bruises to your health care provider immediately. SEEK MEDICAL CARE IF:   You have an injury that is not healing.  You have cuts or breaks in the skin.  You have an ingrown nail.  You notice redness on your legs or feet.  You feel burning or tingling in your legs or feet.  You have pain or cramps in your legs and feet.  Your legs or feet are numb.  Your feet always feel cold. SEEK IMMEDIATE MEDICAL CARE IF:   There is increasing redness,   swelling, or pain in or around a wound.  There is a red line that goes up your leg.  Pus is coming from a wound.  You develop a fever or as directed by your health care provider.  You notice a bad smell coming from an ulcer or wound. Document Released: 08/18/2000 Document Revised: 04/23/2013 Document Reviewed: 01/28/2013 [employer] Patient Information 2015 [employer]. This information is not intended to replace advice given to you by your health care provider. Make sure you discuss any questions you have with your health care provider.  

## 2014-07-20 NOTE — Progress Notes (Signed)
   Subjective:    Patient ID: Stephanie Brock, female    DOB: 1941/07/10, 73 y.o.   MRN: [id number]  HPI  TOENAILS TRIM AND LT FOOT HAVE CALLUS CHECK.  Review of Systems no new findings or systemic changes noted     Objective:   Physical Exam 73 year old white female well-developed well-nourished oriented 3 presents this time for follow-up diabetic foot and nail care patient has had previous partial indications first and second right also fifth met head resection left. Patient does have diabetes it from neuropathy and angiopathy issues. Pre-ulcer sub-fifth MTP area left with hemorrhage a keratoses no open wounds no ulcers no secondary infection noted patient also severe rigid digital contractures 1 through 5 left with osteoarthropathy and third fourth and fifth toes right foot with the third right being most rigidly contracted with distal clavus-like formation on the portion of the distal toe and thickened and criptotic incurvated brittle crumbly friable nails nails discolored friable brittle crumbly 3, 4 and 5 on the right one through 5 on the left.no active infections patient also New pacemaker recently otherwise no other issues mild lymphedema of both lower extremity is mild varicosities noted history of ulcerations and complications wearing an AFO brace on the right side with gait abnormality does have need for different shoe sizing and fitting we'll obtain authorization for new diabetic extra depth shoes at next visit we'll likely get measurement and fitting is needed       Assessment & Plan:  Assessment diabetes with history peripheral neuropathy and angiopathy multiple keratoses keratotic lesion sub-5 left is debrided with hemorrhage a keratoses no open wounds no ulcer down to dermal level only also this time debridement of mycotic brittle crumbly dystrophic nails 3 nails on the right all 5 nails on the left return for follow-up and continued palliative care in the future 2-3 months as needed  next  [name] DPM

## 2014-08-24 ENCOUNTER — Ambulatory Visit (INDEPENDENT_AMBULATORY_CARE_PROVIDER_SITE_OTHER): Payer: Medicare Other

## 2014-08-24 VITALS — BP 114/62 | HR 69 | Resp 12

## 2014-08-24 DIAGNOSIS — L02619 Cutaneous abscess of unspecified foot: Secondary | ICD-10-CM

## 2014-08-24 DIAGNOSIS — L97521 Non-pressure chronic ulcer of other part of left foot limited to breakdown of skin: Secondary | ICD-10-CM

## 2014-08-24 DIAGNOSIS — E1040 Type 1 diabetes mellitus with diabetic neuropathy, unspecified: Secondary | ICD-10-CM

## 2014-08-24 DIAGNOSIS — M79673 Pain in unspecified foot: Secondary | ICD-10-CM

## 2014-08-24 DIAGNOSIS — L03119 Cellulitis of unspecified part of limb: Secondary | ICD-10-CM

## 2014-08-24 MED ORDER — SILVER SULFADIAZINE 1 % EX CREA: TOPICAL_CREAM | CUTANEOUS | Status: AC

## 2014-08-24 NOTE — Patient Instructions (Signed)
ANTIBACTERIAL SOAP INSTRUCTIONS  THE DAY AFTER PROCEDURE  Please follow the instructions your doctor has marked.   Shower as usual. Before getting out, place a drop of antibacterial liquid soap (Dial) on a wet, clean washcloth.  Gently wipe washcloth over affected area.  Afterward, rinse the area with warm water.  Blot the area dry with a soft cloth and cover with antibiotic ointment (neosporin, polysporin, bacitracin) and band aid or gauze and tape  Place 3-4 drops of antibacterial liquid soap in a quart of warm tap water.  Submerge foot into water for 20 minutes.  If bandage was applied after your procedure, leave on to allow for easy lift off, then remove and continue with soak for the remaining time.  Next, blot area dry with a soft cloth and cover with a bandage.  Apply other medications as directed by your doctor, such as cortisporin otic solution (eardrops) or neosporin antibiotic ointment  After soaking apply Silvadene and gauze dressing as instructed

## 2014-08-24 NOTE — Progress Notes (Signed)
   Subjective:    Patient ID: Stephanie Brock, female    DOB: Sep 09, 1940, 73 y.o.   MRN: [id number]  HPI ''LT FOOT CALLUS IS BLEEDING, DRAINING, AND A LITTLE SORE COUPLE A DAYS AGO.''   Review of Systems no new findings or systemic changes noted     Objective:   Physical Exam 73 year old female well-developed well-nourished pleasant history of partial amputation is long-standing history of diabetes with neuropathy and angiopathy complications has venous stasis dermatitis both ankles severe arthropathy of the digits with rigid contracture of partial amputations of the right toe and second toe right patient's left foot does have arthropathy with deformity of digits there is prominence of the fifth met head laterally with hemorrhage a keratoses and ulceration being noted there is some dried blood discharge drainage right lateral fifth metatarsal area left foot on exam reveals ulceration down to subcutaneous tissue level proxy 1 cm ulcers identified with depth down to subcutaneous tissue no purulence slight malodor is noted no ascending cellulitis or lymphangitis localized cellulitis present patient has profound neuropathy shoes with custom insoles modifications for gait       Assessment & Plan:  Assessment recurrence of ulceration and chronic ulceration problem with diabetic Charcot changes of the foot and neurovascular compromise of the foot left ulceration sub-fifth left identified debrided down to subcutaneous tissue Silvadene and gauze dressing applied patient is oriented on doxycycline antibiotic to a GI infection. We'll continue with doxycycline and is well as a topical Silvadene cream. Silvadene gauze dressing applied after debridement aggressively maintain daily dressing changes instructed recheck in 2 weeks for follow-up patient contact me for any fever chills or any other exacerbations were to occur  [name] DPM

## 2014-09-07 ENCOUNTER — Ambulatory Visit (INDEPENDENT_AMBULATORY_CARE_PROVIDER_SITE_OTHER): Payer: Medicare Other

## 2014-09-07 VITALS — BP 124/60 | HR 74 | Resp 12

## 2014-09-07 DIAGNOSIS — M79673 Pain in unspecified foot: Secondary | ICD-10-CM

## 2014-09-07 DIAGNOSIS — L03119 Cellulitis of unspecified part of limb: Secondary | ICD-10-CM

## 2014-09-07 DIAGNOSIS — E1040 Type 1 diabetes mellitus with diabetic neuropathy, unspecified: Secondary | ICD-10-CM

## 2014-09-07 DIAGNOSIS — L02619 Cutaneous abscess of unspecified foot: Secondary | ICD-10-CM

## 2014-09-07 DIAGNOSIS — L97511 Non-pressure chronic ulcer of other part of right foot limited to breakdown of skin: Secondary | ICD-10-CM

## 2014-09-07 DIAGNOSIS — R609 Edema, unspecified: Secondary | ICD-10-CM

## 2014-09-07 DIAGNOSIS — L97521 Non-pressure chronic ulcer of other part of left foot limited to breakdown of skin: Secondary | ICD-10-CM

## 2014-09-07 MED ORDER — LEVOFLOXACIN 500 MG PO TABS: 500.0000 mg | ORAL_TABLET | Freq: Every day | ORAL | Status: AC

## 2014-09-07 NOTE — Patient Instructions (Signed)
ANTIBACTERIAL SOAP INSTRUCTIONS  THE DAY AFTER PROCEDURE  Please follow the instructions your doctor has marked.   Shower as usual. Before getting out, place a drop of antibacterial liquid soap (Dial) on a wet, clean washcloth.  Gently wipe washcloth over affected area.  Afterward, rinse the area with warm water.  Blot the area dry with a soft cloth and cover with antibiotic ointment (neosporin, polysporin, bacitracin) and band aid or gauze and tape  Place 3-4 drops of antibacterial liquid soap in a quart of warm tap water.  Submerge foot into water for 20 minutes.  If bandage was applied after your procedure, leave on to allow for easy lift off, then remove and continue with soak for the remaining time.  Next, blot area dry with a soft cloth and cover with a bandage.  Apply other medications as directed by your doctor, such as cortisporin otic solution (eardrops) or neosporin antibiotic ointment  After washing daily dry and apply Silvadene and gauze dressing as instructed

## 2014-09-07 NOTE — Progress Notes (Signed)
   Subjective:    Patient ID: Stephanie Brock, female    DOB: 1941-08-02, 74 y.o.   MRN: [id number]  HPI  ''LT FOOT HAVE A LITTLE DRAINIGE AND FOOT IS WOLLEN.''  Review of Systems no new findings or systemic changes noted    Objective:   Physical Exam Patient's had some increased swelling and she felt some chills the other night's had some swelling around her left ankle posterior ankle and calf. She's had a history of cellulitis there before and as a precaution is placed on a regimen of Levaquin today. The ulcer site sub-fifth MTP area left appears to have a good pink granular base however surrounding hemorrhagic keratoses is identified this is debrided away no malodor no purulence mild serous drainage noted no increased temperature around the ulcer site the feverishness is more in the upper ankle and mid leg. Patient had a history of stress fractures severe arthropathy patient does have pain in both legs again severe digital contractures arthropathy HAV deformities history of ulcerations and partial amputations with diabetes in peripheral neuropathy and angiopathy noted.       Assessment & Plan:  Assessment slowly resolving ulcer sub-fifth left the ulcers debrided down to the dermal subdermal junction Silvadene and gauze dressing applied prescription for Levaquin is issued Levaquin 500 mg daily patient will be recheck in 2 weeks for follow-up continue to offload ulcer site which is redressed at this time patient some concern about her second toe right foot which shows some bruising does have x-rays her third toe right foot there is some digital contractures with some hemorrhagic keratoses of the nailbed due to walking on the tip of the toe is more with subungual hematoma no active discharge drainage or bleeding is noted on the right foot. Ulcer left foot is redressed and recheck in 2 weeks as instructed  [name] DPM

## 2014-09-21 ENCOUNTER — Telehealth: Payer: Self-pay

## 2014-09-21 ENCOUNTER — Ambulatory Visit: Payer: Medicare Other

## 2014-09-21 NOTE — Telephone Encounter (Signed)
Pt called stating that she has arthritis in her feet and needs to speak with her doctor or nurse

## 2014-09-28 ENCOUNTER — Ambulatory Visit: Payer: Medicare Other

## 2014-09-29 ENCOUNTER — Ambulatory Visit (INDEPENDENT_AMBULATORY_CARE_PROVIDER_SITE_OTHER): Payer: Medicare Other

## 2014-09-29 VITALS — BP 130/58 | HR 78 | Temp 97.1°F | Resp 14

## 2014-09-29 DIAGNOSIS — R609 Edema, unspecified: Secondary | ICD-10-CM

## 2014-09-29 DIAGNOSIS — L02619 Cutaneous abscess of unspecified foot: Secondary | ICD-10-CM

## 2014-09-29 DIAGNOSIS — L89891 Pressure ulcer of other site, stage 1: Secondary | ICD-10-CM

## 2014-09-29 DIAGNOSIS — L03119 Cellulitis of unspecified part of limb: Secondary | ICD-10-CM

## 2014-09-29 DIAGNOSIS — L97521 Non-pressure chronic ulcer of other part of left foot limited to breakdown of skin: Secondary | ICD-10-CM

## 2014-09-29 DIAGNOSIS — E1040 Type 1 diabetes mellitus with diabetic neuropathy, unspecified: Secondary | ICD-10-CM

## 2014-09-29 NOTE — Patient Instructions (Signed)
ANTIBACTERIAL SOAP INSTRUCTIONS  THE DAY AFTER PROCEDURE  Please follow the instructions your doctor has marked.   Shower as usual. Before getting out, place a drop of antibacterial liquid soap (Dial) on a wet, clean washcloth.  Gently wipe washcloth over affected area.  Afterward, rinse the area with warm water.  Blot the area dry with a soft cloth and cover with antibiotic ointment (neosporin, polysporin, bacitracin) and band aid or gauze and tape  Place 3-4 drops of antibacterial liquid soap in a quart of warm tap water.  Submerge foot into water for 20 minutes.  If bandage was applied after your procedure, leave on to allow for easy lift off, then remove and continue with soak for the remaining time.  Next, blot area dry with a soft cloth and cover with a bandage.  Apply other medications as directed by your doctor, such as cortisporin otic solution (eardrops) or neosporin antibiotic ointment  Cleanse the wound of the medial ankle and fifth toe area once daily with soap and water dry thoroughly apply Silvadene and gauze dressing with paper tape

## 2014-09-29 NOTE — Progress Notes (Signed)
   Subjective:    Patient ID: Stephanie Brock, female    DOB: [birth year], 74 y.o.   MRN: [id number]  HPI Comments: Pt states she was released from the [facility] for left foot infection on 09/22/2014, and back in the ER on Thursday 09/23/2014.  Pt states the left medial ankle was red, draining with streaking up the leg.     Review of Systems no new findings or systemic changes noted    Objective:   Physical Exam Lower extremity objective findings unchanged vascular status is intact DP +2 PT thready one over 4 bilateral patient is venous insufficiency and edema issues does have severe arthropathy and digital deformities. Partial amputations right foot. Views of ulceration fifth MTP area left foot as well as cellulitis of the left lower leg and shin and medial malleoli are area diffuse superficial dermal ulceration on the left medial ankle. Patient is currently on clindamycin antibiotic will continue to be on that 3 times daily as instructed. Patient is also doing dressing changes with Silvadene gauze dressing as instructed. She was hospitalized twice within the past week however this seems to be improving the stable at this point       Assessment & Plan:  Assessment diabetic neuropathic ulcer associated with faster cover minus as well the ulcers debrided down to subcutaneous tissue level Silvadene and gauze dressing applied to the fifth MTP area left foot the ulcer is a proxy 1 cm in diameter with full thickness down to subcutaneous tissue level. There is an excoriation of skin is mild serous drainage medial malleoli are area of the left ankle is also dressed with Silvadene and gauze dressing. Recheck in 2 weeks for office for follow-up maintain anabolic regimen as instructed contact us immediately if there is any changes or exacerbations at any time  [name] DPM

## 2014-10-12 ENCOUNTER — Ambulatory Visit (INDEPENDENT_AMBULATORY_CARE_PROVIDER_SITE_OTHER): Payer: Medicare Other

## 2014-10-12 VITALS — BP 114/59 | HR 77 | Resp 18

## 2014-10-12 DIAGNOSIS — I83012 Varicose veins of right lower extremity with ulcer of calf: Secondary | ICD-10-CM

## 2014-10-12 DIAGNOSIS — E1040 Type 1 diabetes mellitus with diabetic neuropathy, unspecified: Secondary | ICD-10-CM | POA: Diagnosis not present

## 2014-10-12 DIAGNOSIS — L89891 Pressure ulcer of other site, stage 1: Secondary | ICD-10-CM | POA: Diagnosis not present

## 2014-10-12 DIAGNOSIS — L97521 Non-pressure chronic ulcer of other part of left foot limited to breakdown of skin: Secondary | ICD-10-CM

## 2014-10-12 DIAGNOSIS — B351 Tinea unguium: Secondary | ICD-10-CM | POA: Diagnosis not present

## 2014-10-12 DIAGNOSIS — I83011 Varicose veins of right lower extremity with ulcer of thigh: Secondary | ICD-10-CM

## 2014-10-12 DIAGNOSIS — M79673 Pain in unspecified foot: Secondary | ICD-10-CM

## 2014-10-12 DIAGNOSIS — IMO0001 Reserved for inherently not codable concepts without codable children: Secondary | ICD-10-CM

## 2014-10-12 DIAGNOSIS — I83019 Varicose veins of right lower extremity with ulcer of unspecified site: Secondary | ICD-10-CM

## 2014-10-12 DIAGNOSIS — I83013 Varicose veins of right lower extremity with ulcer of ankle: Secondary | ICD-10-CM

## 2014-10-12 DIAGNOSIS — I83018 Varicose veins of right lower extremity with ulcer other part of lower leg: Secondary | ICD-10-CM

## 2014-10-12 DIAGNOSIS — I83014 Varicose veins of right lower extremity with ulcer of heel and midfoot: Secondary | ICD-10-CM

## 2014-10-12 DIAGNOSIS — R609 Edema, unspecified: Secondary | ICD-10-CM

## 2014-10-12 DIAGNOSIS — I83015 Varicose veins of right lower extremity with ulcer other part of foot: Secondary | ICD-10-CM

## 2014-10-12 DIAGNOSIS — Q828 Other specified congenital malformations of skin: Secondary | ICD-10-CM

## 2014-10-12 NOTE — Progress Notes (Signed)
   Subjective:    Patient ID: Stephanie Brock, female    DOB: 1940-10-13, 74 y.o.   MRN: [id number]  HPI I NEED MY TOENAILS TRIMMED UP AND THIS PLACE IS DRAINING SOME ON MY LEFT FOOT    Review of Systems no new findings or systemic changes noted     Objective:   Physical Exam Lower extremity objective findings unchanged ask her status DP +2 PT thready one over 4 bilateral Refill time 3 seconds this time thick brittle dystrophic frontal mycotic nails 34 and 5 on the right 1334 and 5 on the left are debrided and the presence of pain symptoms while G as well as onychomycosis and diabetes and complication. The ulcer sub-fifth MTP area left still is about a 5 x 8 mm ulceration down to subcutaneous tissue level. Thickness through skin down to subcutaneous tissue level mild serous drainage is noted there is a second keratotic lesion sub-fourth MTP area which is also debrided at this time our no active bleeder discharge no ascending psoas or lymphangitis the leg is gone down and swelling and edema seems to be stable patient's had a history of pneumonia an upper respiratory infection as well as cellulite is of the leg which seems to have resolved or improved.       Assessment & Plan:  Assessment diabetes with history peripheral neuropathy history of ulceration sub-fifth left ulcer is debrided down to subcutaneous tissue level Silvadene and gauze dressing applied debridement of dystrophic frontal mycotic nails 834 and 5 right 1334 and 5 left are debrided at this time return in 3 months for palliative nail care return in 3 weeks for ulcer check and diabetic shoe measurement at that time authorization has been obtained patient likely needs new diabetic insoles with additional pocketing for the metatarsal this may help with the breakdown of the ulceration in the future. Extra graft [name] DPM

## 2014-11-02 ENCOUNTER — Ambulatory Visit (INDEPENDENT_AMBULATORY_CARE_PROVIDER_SITE_OTHER): Payer: Medicare Other

## 2014-11-02 DIAGNOSIS — E1040 Type 1 diabetes mellitus with diabetic neuropathy, unspecified: Secondary | ICD-10-CM

## 2014-11-02 DIAGNOSIS — E1141 Type 2 diabetes mellitus with diabetic mononeuropathy: Secondary | ICD-10-CM

## 2014-11-02 DIAGNOSIS — L97521 Non-pressure chronic ulcer of other part of left foot limited to breakdown of skin: Secondary | ICD-10-CM

## 2014-11-02 DIAGNOSIS — L89891 Pressure ulcer of other site, stage 1: Secondary | ICD-10-CM

## 2014-11-02 DIAGNOSIS — Q828 Other specified congenital malformations of skin: Secondary | ICD-10-CM

## 2014-11-02 NOTE — Progress Notes (Signed)
   Subjective:    Patient ID: Stephanie Brock, female    DOB: [birth year], 74 y.o.   MRN: [id number]  HPI I HAVE A PLACE ON THE RIGHT BALL OF MY FOOT AND THERE ARE TWO PLACES ON THE LEFT FOOT AND I HAVE A SPOT ON THE LEFT HEEL AS WELL AND MY LEFT FOOT IS A LITTLE RED AND SWOLLEN SOME AND I HAVE BEEN WEARING THE BRACE    Review of Systems no new findings or systemic changes noted     Objective:   Physical Exam Neurovascular status unchanged pedal pulses DP +2 PT thready one over 4 bilateral Refill time 3 seconds there is ulcerations sub-fifth MTP area of the left foot to ulcers fifth met base and some of the fourth met base both down to dermal subdermal junction partial-thickness or full-thickness through skin was not good down to bone or capsule no ulcerations on the right foot has status post amputation of toe right toe has contracted third toe which tube foam padding is applied 2. Continues to have arthritic and Charcot-type changes of left foot with rigid contractures of toes foot and ankle and secondary arthritis pinches aggravated by weather changes etc. No ascending psoas lymphangitis no increased temperature no fever chills mild serous drainage from the ulcer sites sub-fifth and fourth left. Patient would benefit from new AFO brace based on arthritic changes and deformity of the foot with continued ulceration nonresponsive to conservative care I think a new AFO brace would be most appropriate at this time as well as new diabetic shoes and insoles      Assessment & Plan:  Assessment this time is diabetes history peripheral neuropathy and angiopathy. Also severe Oster arthropathy and deformity patient's AFO braces more and 75 years old likely need replacing at this time the last outlining his is likely not offloading taking pressure off the plantar foot as appropriate at this time will schedule with the next 3 weeks for ulcer check in follow-up and as well as a visit with the pain or cyst for  possible new AFO brace Othal [place] brace with an forefoot extension Plastizote lining. Will also make arrangements for new diabetic extra-depth shoes at that time as well. Continue with local wound care Silvadene gauze dressing applied after debridement down to the dermal subcutaneous junction at this time old to ulcers left foot maintain Silvadene gauze dressings daily continue to offload with extra depth shoes and AFO brace. Contact us immediately if there is any changes or exacerbations at any time  [name] DPM

## 2014-11-20 ENCOUNTER — Ambulatory Visit (INDEPENDENT_AMBULATORY_CARE_PROVIDER_SITE_OTHER): Payer: Medicare Other

## 2014-11-20 DIAGNOSIS — Z89431 Acquired absence of right foot: Secondary | ICD-10-CM

## 2014-11-20 DIAGNOSIS — E1040 Type 1 diabetes mellitus with diabetic neuropathy, unspecified: Secondary | ICD-10-CM

## 2014-11-20 DIAGNOSIS — L97521 Non-pressure chronic ulcer of other part of left foot limited to breakdown of skin: Secondary | ICD-10-CM

## 2014-11-20 DIAGNOSIS — M2012 Hallux valgus (acquired), left foot: Secondary | ICD-10-CM

## 2014-11-20 DIAGNOSIS — R609 Edema, unspecified: Secondary | ICD-10-CM

## 2014-11-20 DIAGNOSIS — E11610 Type 2 diabetes mellitus with diabetic neuropathic arthropathy: Secondary | ICD-10-CM

## 2014-11-20 NOTE — Progress Notes (Signed)
   Subjective:    Patient ID: Stephanie Brock, female    DOB: 1940-11-28, 74 y.o.   MRN: [id number]  HPI I AM HERE TO GET MEASURED FOR DIABETIC SHOES    Review of Systems no new findings or systemic changes     Objective:   Physical Exam Vascular status is intact although diminished. DP +2 PT 1 over 4 Refill time 4 seconds patient does have history and dictation right great toe multiple digital contractures and severe arthropathy affecting lesser digits and great toe left. Continues to have chronic ulceration sub-fifth metatarsal left is ready for new shoes previous shoes are worn and need replacing at this time. As of diabetes cocking factors both neuropathy and angiopathy issues with arthritic and Charcot like changes of both feet being noted. There certainly gait abnormality affecting stability and pressure areas on both feet. History of chronic and repetitive ulcerations are noted       Assessment & Plan:  Assessment diabetes history peripheral neuropathy as well as multiple deformities history of amputations and complications current ulcer still being treated measurements for  Diabetic extra-depth shoes and molded inlays are carried out at this time and the patient we follow-up appropriate when the inlays and shoes are ready for fitting and dispensing within the next month  [name] DPM

## 2014-12-08 ENCOUNTER — Encounter: Payer: Self-pay | Admitting: Podiatrist

## 2014-12-08 ENCOUNTER — Ambulatory Visit (INDEPENDENT_AMBULATORY_CARE_PROVIDER_SITE_OTHER): Payer: Medicare Other | Admitting: Podiatrist

## 2014-12-08 VITALS — BP 114/58 | HR 79 | Resp 18

## 2014-12-08 DIAGNOSIS — L89891 Pressure ulcer of other site, stage 1: Secondary | ICD-10-CM

## 2014-12-08 DIAGNOSIS — E1040 Type 1 diabetes mellitus with diabetic neuropathy, unspecified: Secondary | ICD-10-CM | POA: Diagnosis not present

## 2014-12-08 DIAGNOSIS — L02619 Cutaneous abscess of unspecified foot: Secondary | ICD-10-CM

## 2014-12-08 DIAGNOSIS — L03119 Cellulitis of unspecified part of limb: Secondary | ICD-10-CM | POA: Diagnosis not present

## 2014-12-08 DIAGNOSIS — L97521 Non-pressure chronic ulcer of other part of left foot limited to breakdown of skin: Secondary | ICD-10-CM

## 2014-12-08 MED ORDER — CLINDAMYCIN HCL 150 MG PO CAPS: 150.0000 mg | ORAL_CAPSULE | Freq: Four times a day (QID) | ORAL | Status: AC

## 2014-12-08 NOTE — Patient Instructions (Signed)
Instructions for Wound Care  The most important step to healing a foot wound is to reduce the pressure on your foot - it is extremely important to stay off your foot as much as possible and wear the shoe/boot as instructed.  Cleanse your foot with saline wash or warm soapy water (dial antibacterial soap or similar).  Blot dry.  Apply prescribed medication to your wound and cover with gauze and a bandage.  May hold bandage in place with Coban (self sticky wrap), Ace bandage or tape.  You may find dressing supplies at your local [employer], [employer], drug store or medical supply store.  Continue to use the silvadene cream with dressings daily.  If you notice any foul odor, increase in pain, pus, increased swelling, red streaks or generalized redness occurring in your foot or leg-Call our office immediately to be seen.  This may be a sign of a limb or life threatening infection that will need prompt attention.

## 2014-12-08 NOTE — Progress Notes (Signed)
     Subjective:    Patient ID: Stephanie Brock, female    DOB: Dec 01, 1940, 74 y.o.   MRN: [id number]  Chief Complaint  Patient presents with  . Foot Ulcer    MY LEFT FOOT HAS GOT AN ODOR AND IS INFECTED    Patient has a long standing ulcer on the lateral plantar surface of the left foot.  She noticed an odor and went to er and they put her on cipro. She relates it is still malodorous and draining.  She denies any nausea, vomiting, fevers or chills.  Denies any sweats or chills.  She is a long time patient of Dr. Blenda Mounts.      Objective:   Physical Exam Neurovascular status unchanged pedal pulses DP +2 PT thready one over 4 bilateral Refill time 3 seconds.  Full thickness ulceration x 2 present along the plantar fifth met left foot.  The proximal ulceration measures 63m in diameter and the distal ulcer measures 485min diameter.  They both have a red, granular base with a rim of hyperkeratotic reactive tissue present.  No probing to bone is noted.  Slight undermining beneath the rim of hpk tissue is present.  Mild malodor is present.  No pus or pirulence expressed.  No streaking or lymphangitis present.      Assessment & Plan:  Assessment:  Ulceration x 2 left foot, diabetes history peripheral neuropathy and angiopathy. osteoarthropathy and deformity  Plan:  Debridement of ulceration x 2 carried out.  Offloaded ulcerations, added clindamycin to her antibiotics which she is already on cipro.  Will recheck her in 2 weeks. She will call if any increased swelling, redness, or drainage arise.

## 2014-12-17 ENCOUNTER — Encounter: Payer: Self-pay | Admitting: Podiatrist

## 2014-12-17 ENCOUNTER — Ambulatory Visit (INDEPENDENT_AMBULATORY_CARE_PROVIDER_SITE_OTHER): Payer: Medicare Other | Admitting: Podiatrist

## 2014-12-17 DIAGNOSIS — L89891 Pressure ulcer of other site, stage 1: Secondary | ICD-10-CM | POA: Diagnosis not present

## 2014-12-17 DIAGNOSIS — Z89431 Acquired absence of right foot: Secondary | ICD-10-CM

## 2014-12-17 DIAGNOSIS — E1040 Type 1 diabetes mellitus with diabetic neuropathy, unspecified: Secondary | ICD-10-CM

## 2014-12-17 DIAGNOSIS — L97521 Non-pressure chronic ulcer of other part of left foot limited to breakdown of skin: Secondary | ICD-10-CM

## 2014-12-17 NOTE — Progress Notes (Signed)
Ulcers x 2 submet 5. Waiting for shoes and brace to come back. Back in 3w  Chief Complaint  Patient presents with  . Foot Ulcer    I AM HERE FOR A FOLLOW UP ON BOTH OF MY FEET    Patient presents today for follow-up of bilateral feet. She has ulcerations on the left foot and she is waiting for her shoes and her brace to get in for pickup.  Objective: Physical examination reveals vascular status diminished at 2+4 PT and 1/4 DP bilateral. History of amputation right great toe and multiple digital contractures are noted. Severe arthropathy is also noted. Chronic ulceration submetatarsal 5 left 2 are present. They measure 5 mm in diameter and 2 mm in depth and have a red granular base present. Charcot changes are noted bilateral. Neuropathy is also present.  Assessment: Diabetes with peripheral neuropathy, multiple arthropathies in deformities, history of amputation, ulceration left foot  Plan: Debridement of ulceration was carried out today without complication. We are awaiting the extra-depth shoes and brace to get in from the manufacture and we will see her back for the dispensing when these are ready. She will follow up for a recheck in 4 weeks

## 2015-01-07 ENCOUNTER — Ambulatory Visit (INDEPENDENT_AMBULATORY_CARE_PROVIDER_SITE_OTHER): Payer: Medicare Other | Admitting: Podiatrist

## 2015-01-07 ENCOUNTER — Encounter: Payer: Self-pay | Admitting: Podiatrist

## 2015-01-07 VITALS — BP 128/63 | HR 77 | Resp 18

## 2015-01-07 DIAGNOSIS — L89891 Pressure ulcer of other site, stage 1: Secondary | ICD-10-CM

## 2015-01-07 DIAGNOSIS — E1040 Type 1 diabetes mellitus with diabetic neuropathy, unspecified: Secondary | ICD-10-CM

## 2015-01-07 DIAGNOSIS — Z89431 Acquired absence of right foot: Secondary | ICD-10-CM

## 2015-01-07 DIAGNOSIS — L97521 Non-pressure chronic ulcer of other part of left foot limited to breakdown of skin: Secondary | ICD-10-CM

## 2015-01-07 NOTE — Progress Notes (Signed)
   Chief Complaint  Patient presents with  . Foot Ulcer    I HAVE SOME PLACES ON MY FEET THAT NEED TO BE TRIMMED UP    Patient presents today for follow-up of bilateral feet. She has ulcerations on the left foot and she is still waiting for her shoes and her brace to get in for pickup.  Objective: Physical examination reveals vascular status diminished at 2+4 PT and 1/4 DP bilateral. History of amputation right great toe and multiple digital contractures are noted. Severe arthropathy is also noted. Chronic ulceration submetatarsal 5 left 2 are present. They measure 4 mm in diameter and 2 mm in depth and have a red granular base present. Charcot changes are noted bilateral. Neuropathy is also present.  Assessment: Diabetes with peripheral neuropathy, multiple arthropathies in deformities, history of amputation, ulceration left foot  Plan: Debridement of ulceration was carried out today without complication. We are awaiting the extra-depth shoes and brace to get in from the manufacture and we will see her back for the dispensing when these are ready. She will follow up for a recheck in 2 weeks

## 2015-01-21 ENCOUNTER — Encounter: Payer: Self-pay | Admitting: Podiatrist

## 2015-01-21 ENCOUNTER — Ambulatory Visit (INDEPENDENT_AMBULATORY_CARE_PROVIDER_SITE_OTHER): Payer: Medicare Other | Admitting: Podiatrist

## 2015-01-21 VITALS — BP 116/52 | HR 89 | Resp 18

## 2015-01-21 DIAGNOSIS — L89891 Pressure ulcer of other site, stage 1: Secondary | ICD-10-CM | POA: Diagnosis not present

## 2015-01-21 DIAGNOSIS — L97522 Non-pressure chronic ulcer of other part of left foot with fat layer exposed: Secondary | ICD-10-CM

## 2015-01-21 DIAGNOSIS — E1040 Type 1 diabetes mellitus with diabetic neuropathy, unspecified: Secondary | ICD-10-CM | POA: Diagnosis not present

## 2015-01-21 NOTE — Progress Notes (Signed)
   Chief Complaint  Patient presents with  . Callouses    CALLUSES ON BOTH OF MY FEET    Patient presents today for follow-up of bilateral feet. She has ulcerations on the left foot and she is still waiting for her shoes and her brace to get in for pickup.  Objective: Physical examination reveals vascular status diminished at 2+4 PT and 1/4 DP bilateral. History of amputation right great toe and multiple digital contractures are noted. Severe arthropathy is also noted. Chronic ulceration submetatarsal 5 left 2 are present. They measure 43mm in diameter, 62mm in dapteh and 1.4x1.2 cm x 84mm in depth and have a red granular base present. Charcot changes are noted bilateral. Neuropathy is also present.  Assessment: Diabetes with peripheral neuropathy, multiple arthropathies in deformities, history of amputation, ulceration left foot  Plan: Debridement of ulceration was carried out today without complication.her diabetic shoes are in but won't fit the bracing that was made for her here.  We will have her see Betha.  She will follow up for a recheck in 2 weeks

## 2015-02-03 ENCOUNTER — Ambulatory Visit (INDEPENDENT_AMBULATORY_CARE_PROVIDER_SITE_OTHER): Payer: Medicare Other | Admitting: Podiatry

## 2015-02-03 ENCOUNTER — Encounter: Payer: Self-pay | Admitting: Podiatry

## 2015-02-03 ENCOUNTER — Ambulatory Visit (INDEPENDENT_AMBULATORY_CARE_PROVIDER_SITE_OTHER): Payer: Medicare Other

## 2015-02-03 VITALS — BP 116/56 | HR 85 | Resp 18

## 2015-02-03 DIAGNOSIS — I739 Peripheral vascular disease, unspecified: Secondary | ICD-10-CM

## 2015-02-03 DIAGNOSIS — I70245 Atherosclerosis of native arteries of left leg with ulceration of other part of foot: Secondary | ICD-10-CM

## 2015-02-03 DIAGNOSIS — L97529 Non-pressure chronic ulcer of other part of left foot with unspecified severity: Secondary | ICD-10-CM

## 2015-02-03 DIAGNOSIS — L089 Local infection of the skin and subcutaneous tissue, unspecified: Secondary | ICD-10-CM

## 2015-02-03 DIAGNOSIS — R52 Pain, unspecified: Secondary | ICD-10-CM

## 2015-02-03 DIAGNOSIS — S90822A Blister (nonthermal), left foot, initial encounter: Secondary | ICD-10-CM | POA: Diagnosis not present

## 2015-02-03 DIAGNOSIS — E11621 Type 2 diabetes mellitus with foot ulcer: Secondary | ICD-10-CM

## 2015-02-05 NOTE — Progress Notes (Signed)
Subjective:     Patient ID: Stephanie Brock, female   DOB: 1941/04/25, 74 y.o.   MRN: [id number]  HPI  This patient presents to the office today with a new complaint of enlarged bloody growth on her big toe left foot.  She has no history of immediate injury to her foot but this spot slowly enlarged since this morning.  She is neuropathic so she has difficulty feeling any type of injury.  She also is a diabetic ulcer patient with a closed ulcer under 4th met left foot and open ulcer under fifth met left foot.  She is most concerned about the growth on her toe but also wants her ulcers evaluated.     Review of Systems     Objective:   Physical Exam  Dorsalis pedis and posterior tibial pulses are palpable.  CR and TG WNL>  Absent Semmes weinstein monofilament wire test.  History of amputation of right great toe and second toe right.  Amputation of 5th digit left foot.  Closed ulcer with hyperkeratotic roof sub 4th met left.  Open ulcer sub 5th left foot is 64mm. X 10 mm..  No drainage noted.  No evidence of swelling erythema or cellulitis noted.  No malodor noted.  Base has good granular base at this time.  She does have blood  filed blister hallux left foot.  No infection at this visit to toe.      Assessment:     Blister left hallux  2.  Diabetic ulcer sub 5th left foot.  Diabetic neuropathy.     Plan:       I & D blister left hallux with bandage.  Debridement of diabetic ulcer with bandage.  Home instructions given for soaks and she is to rebandage at home. X-ray was taken to rule out infection or fracture to bone.

## 2015-02-10 ENCOUNTER — Ambulatory Visit: Payer: Medicare Other | Admitting: Podiatry

## 2015-02-15 ENCOUNTER — Ambulatory Visit: Payer: Medicare Other | Admitting: Podiatry

## 2015-04-29 ENCOUNTER — Telehealth: Payer: Self-pay | Admitting: Podiatry

## 2015-04-29 NOTE — Telephone Encounter (Signed)
Patient called and spoke with Cyndy Freeze that at her last visit with Dr. Prudence Davidson he took an instrument and popped a blood blister that she had on her toe.  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  She reported that the Drs at [facility] told her to never come back to Dr. Prudence Davidson as he caused her problem and he does not know proper diabetic foot care.  She also spoke to [place] concerning a bill she received for diabetic shoes that she never got.

## 2015-05-03 NOTE — Telephone Encounter (Signed)
Please print her chart note for me to look at

## 2015-09-11 IMAGING — CR DG FOOT COMPLETE 3+V*R*
3 series · 3 of 3 positions shown · non-contrast
Comparison: 01/12/2007

CLINICAL DATA: Nonhealing wound 1st metatarsal phalangeal joint

EXAM:
RIGHT FOOT COMPLETE - 3+ VIEW

[x foot ap right]
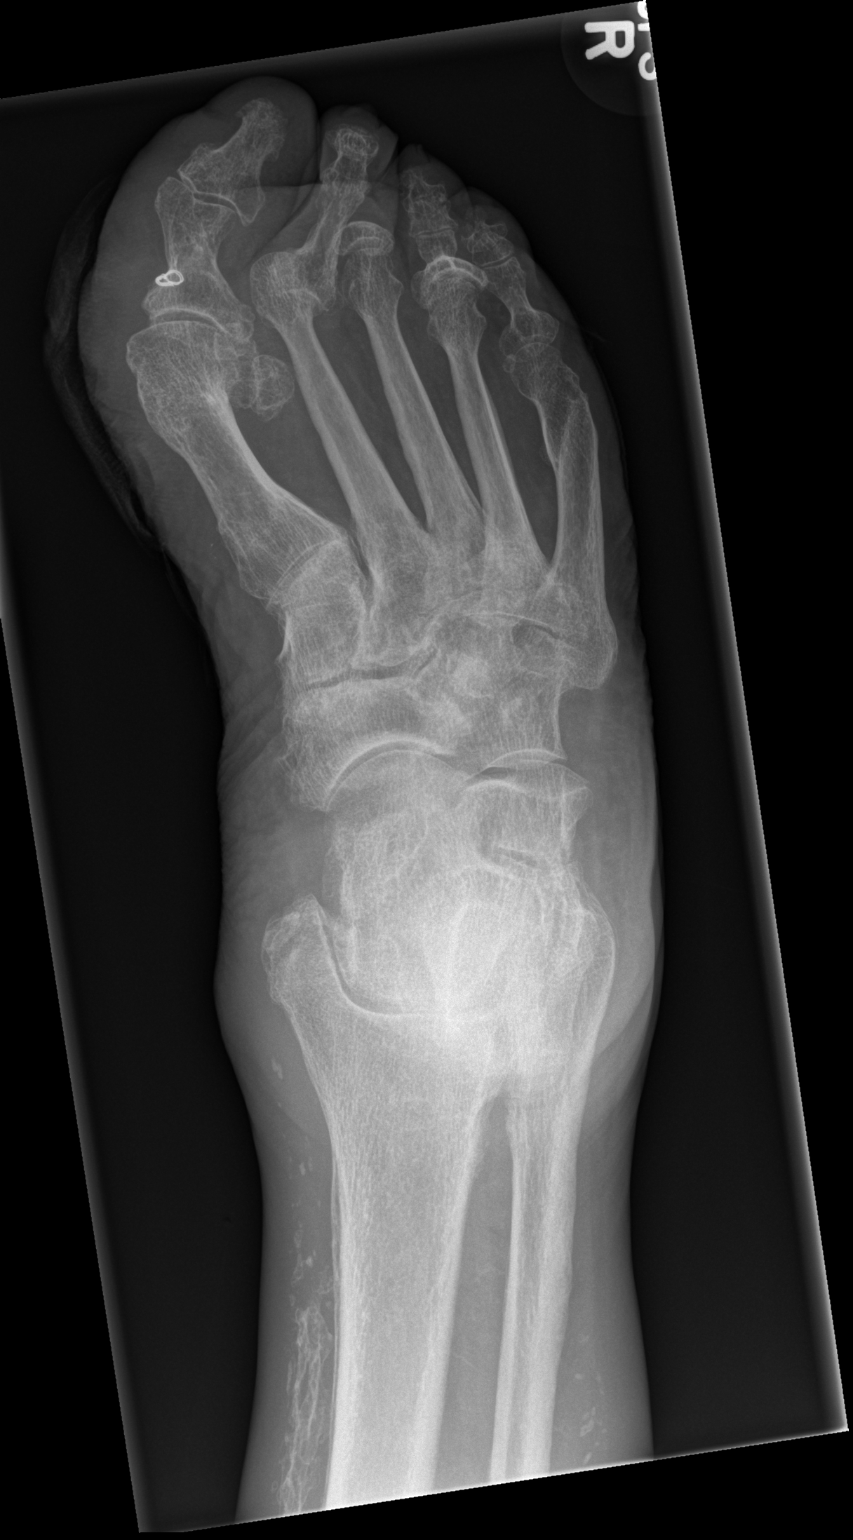

[x foot obl right]
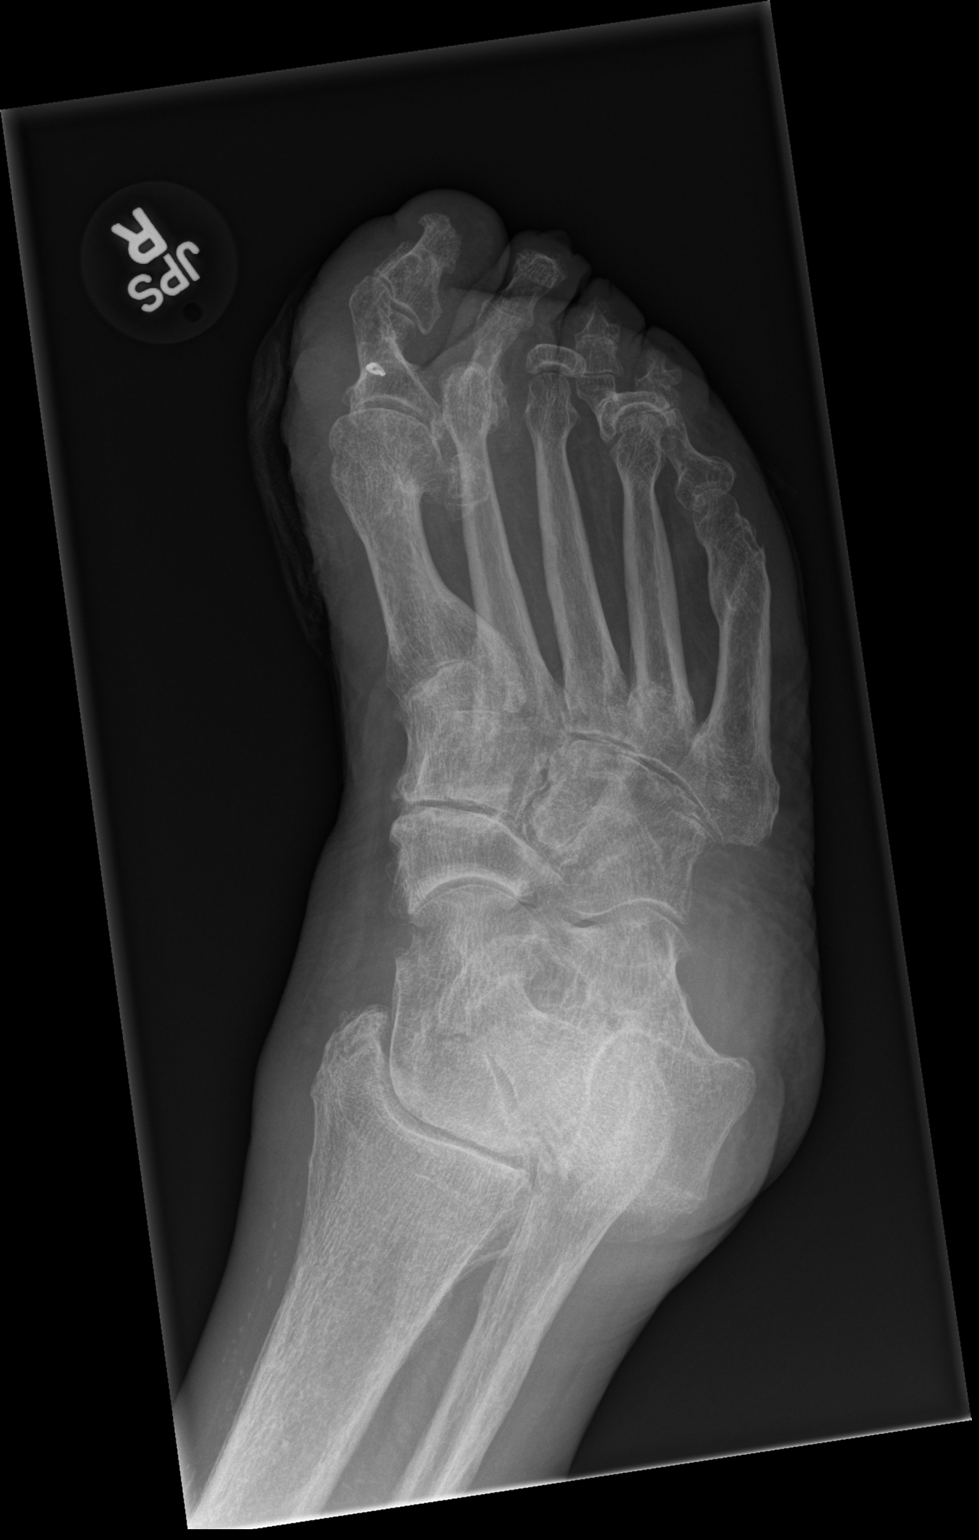

[x foot lat right]
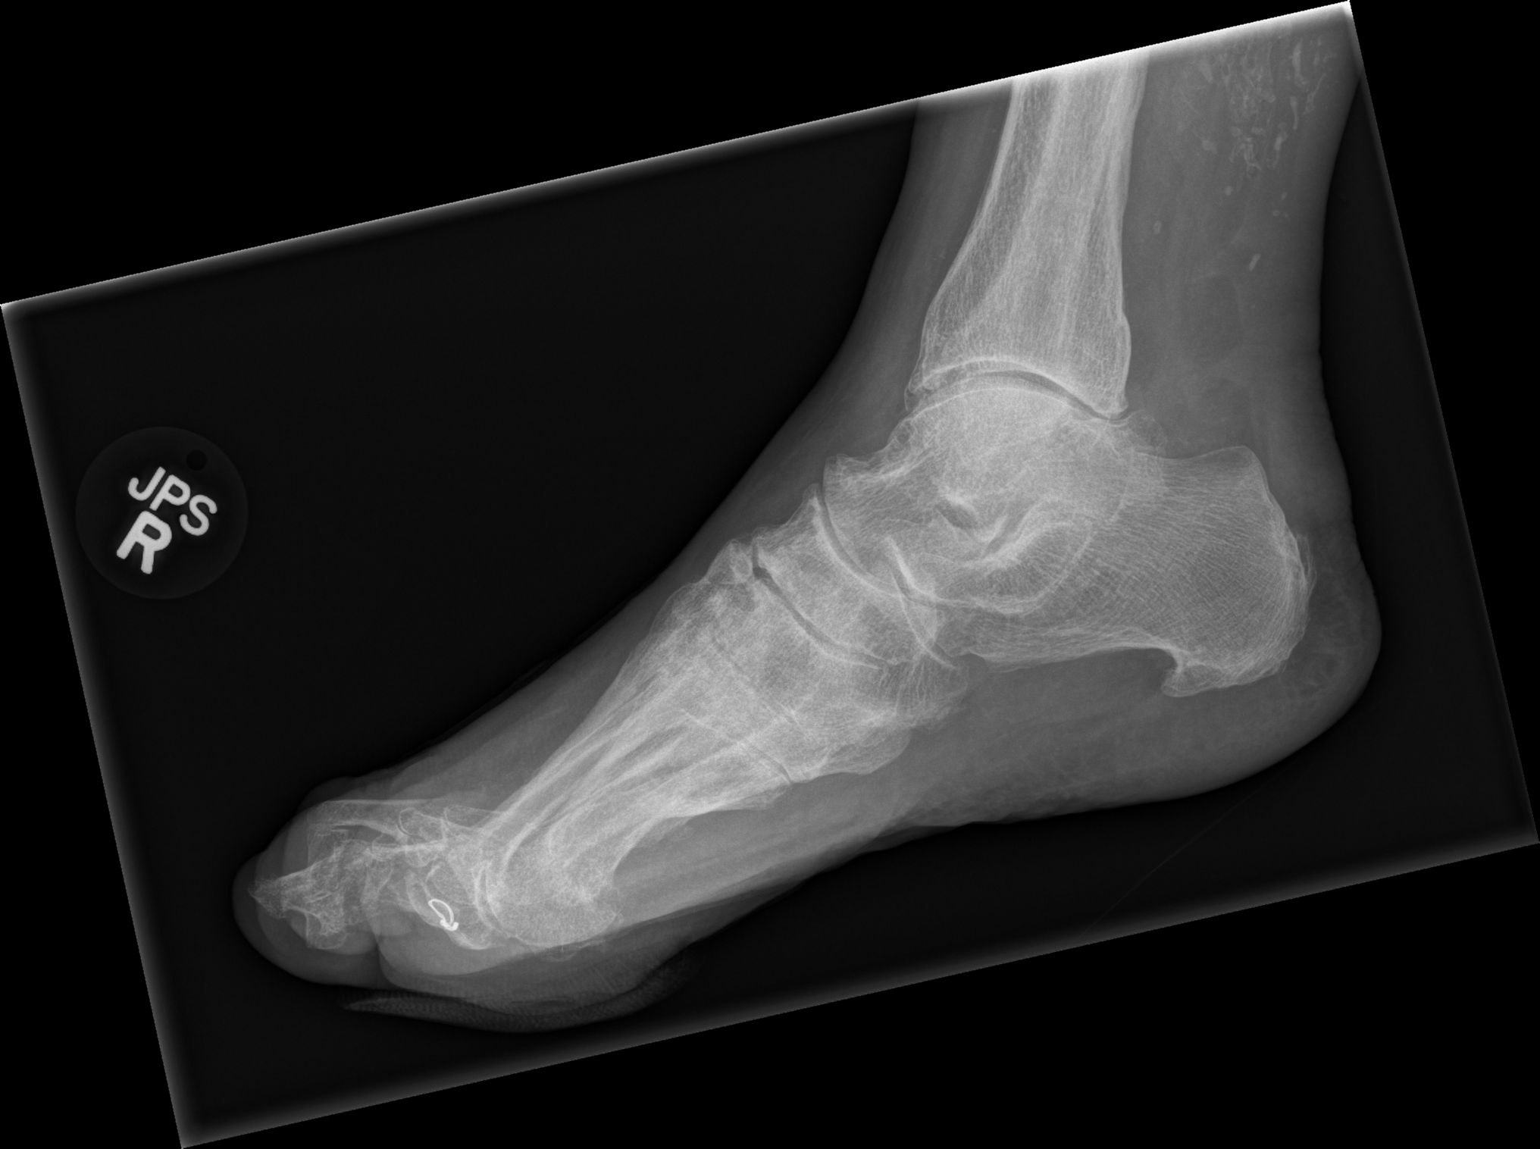

[3 of 3 positions shown; findings below may reference images not displayed]

FINDINGS: Three views of the right foot submitted. Again noted prior
amputation of 3rd toe. There is diffuse osteopenia. Hallux valgus
deformity. Degenerative changes and probable chronic subluxation of
2nd and 4th metatarsal phalangeal joint. There is plantar spurring
of calcaneus. Dorsal spurring tarsal region. Significant soft tissue
swelling great toe and medial to 1st metatarsal. There is soft
tissue irregularity probable wound in this region. No definite bone
destruction or erosion to suggest osteomyelitis. Stable postsurgical
changes in the base of 1st proximal phalanx. Stable old fracture
deformity 5th metatarsal.
IMPRESSION: Again noted prior amputation of 3rd toe. There is diffuse
osteopenia. Hallux valgus deformity. Degenerative changes and
probable chronic subluxation of 2nd and 4th metatarsal phalangeal
joint. There is plantar spurring of calcaneus. Dorsal spurring
tarsal region. Significant soft tissue swelling great toe and medial
to 1st metatarsal. There is soft tissue irregularity probable wound
in this region. No definite bone destruction or erosion to suggest
osteomyelitis. Stable postsurgical changes in the base of 1st
proximal phalanx.

## 2015-09-12 IMAGING — CR DG FOOT COMPLETE 3+V*R*
3 series · 3 of 3 positions shown · non-contrast
Comparison: 06/23/2013

CLINICAL DATA: Post amputation

EXAM:
RIGHT FOOT COMPLETE - 3+ VIEW

[view not recorded (1 of 3)]
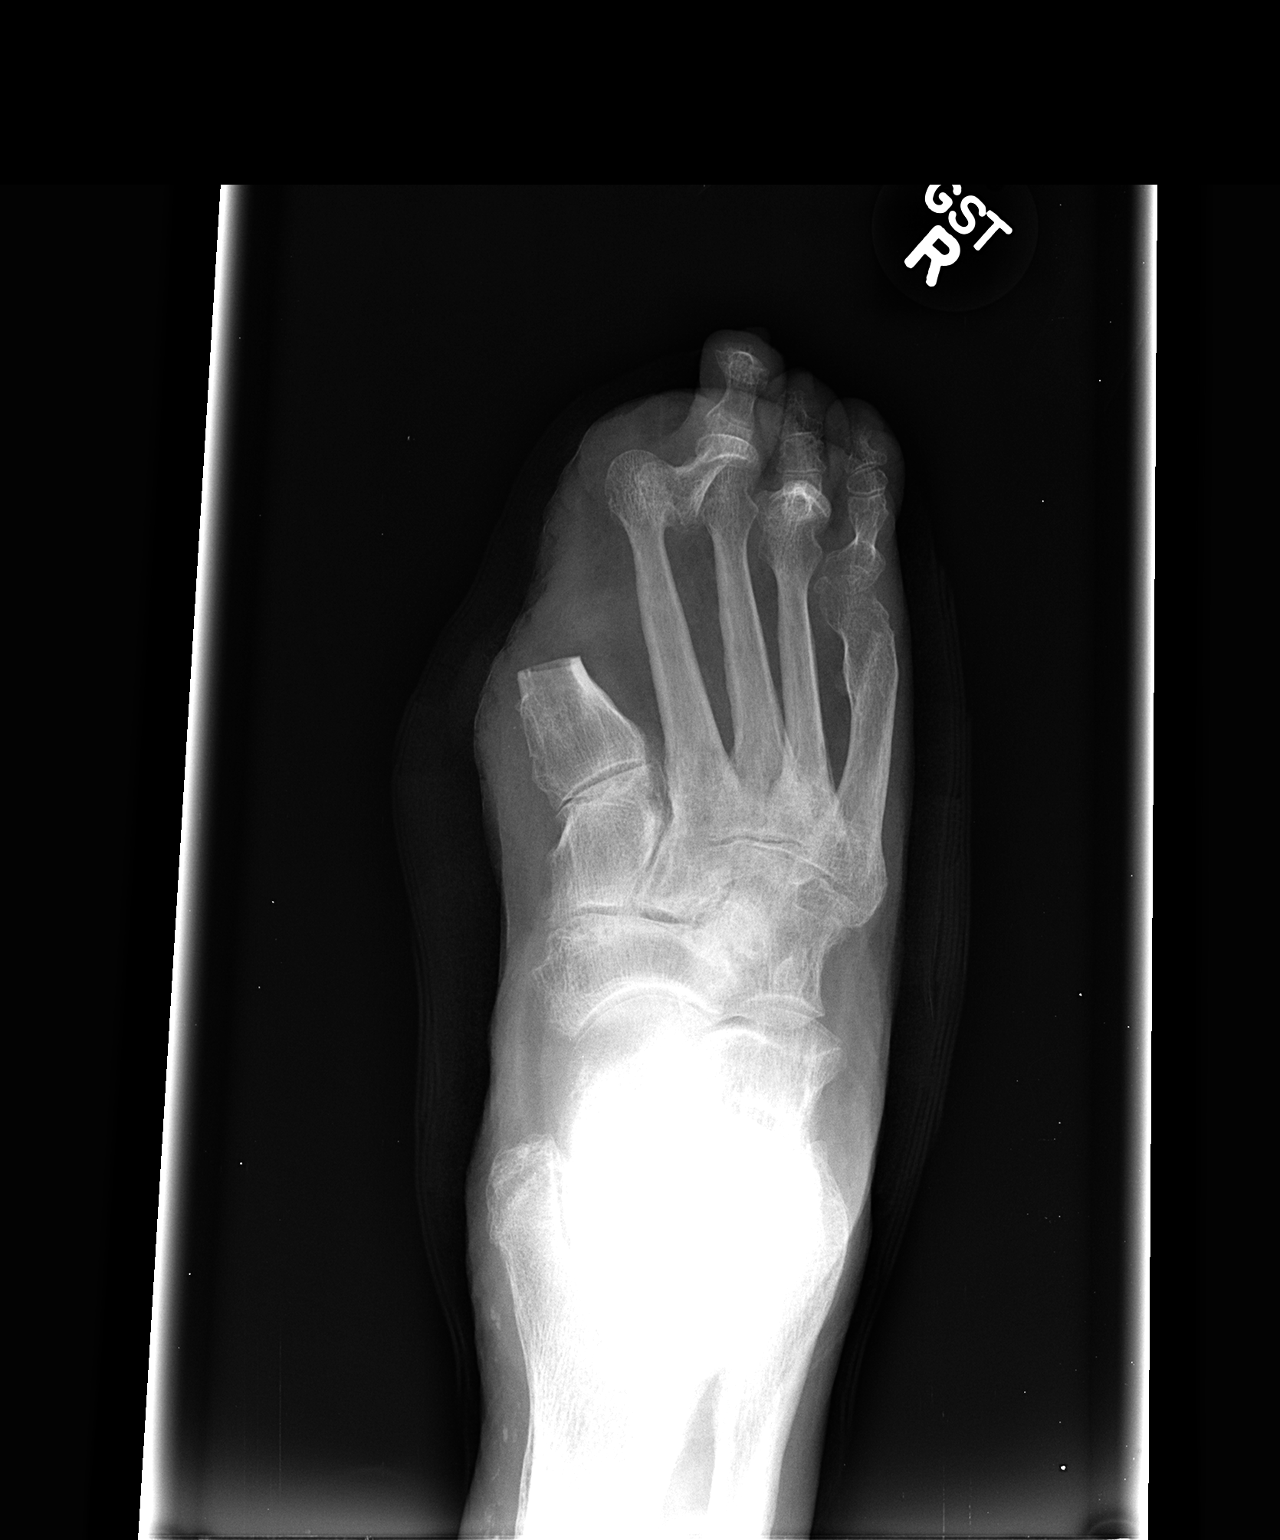

[view not recorded (2 of 3)]
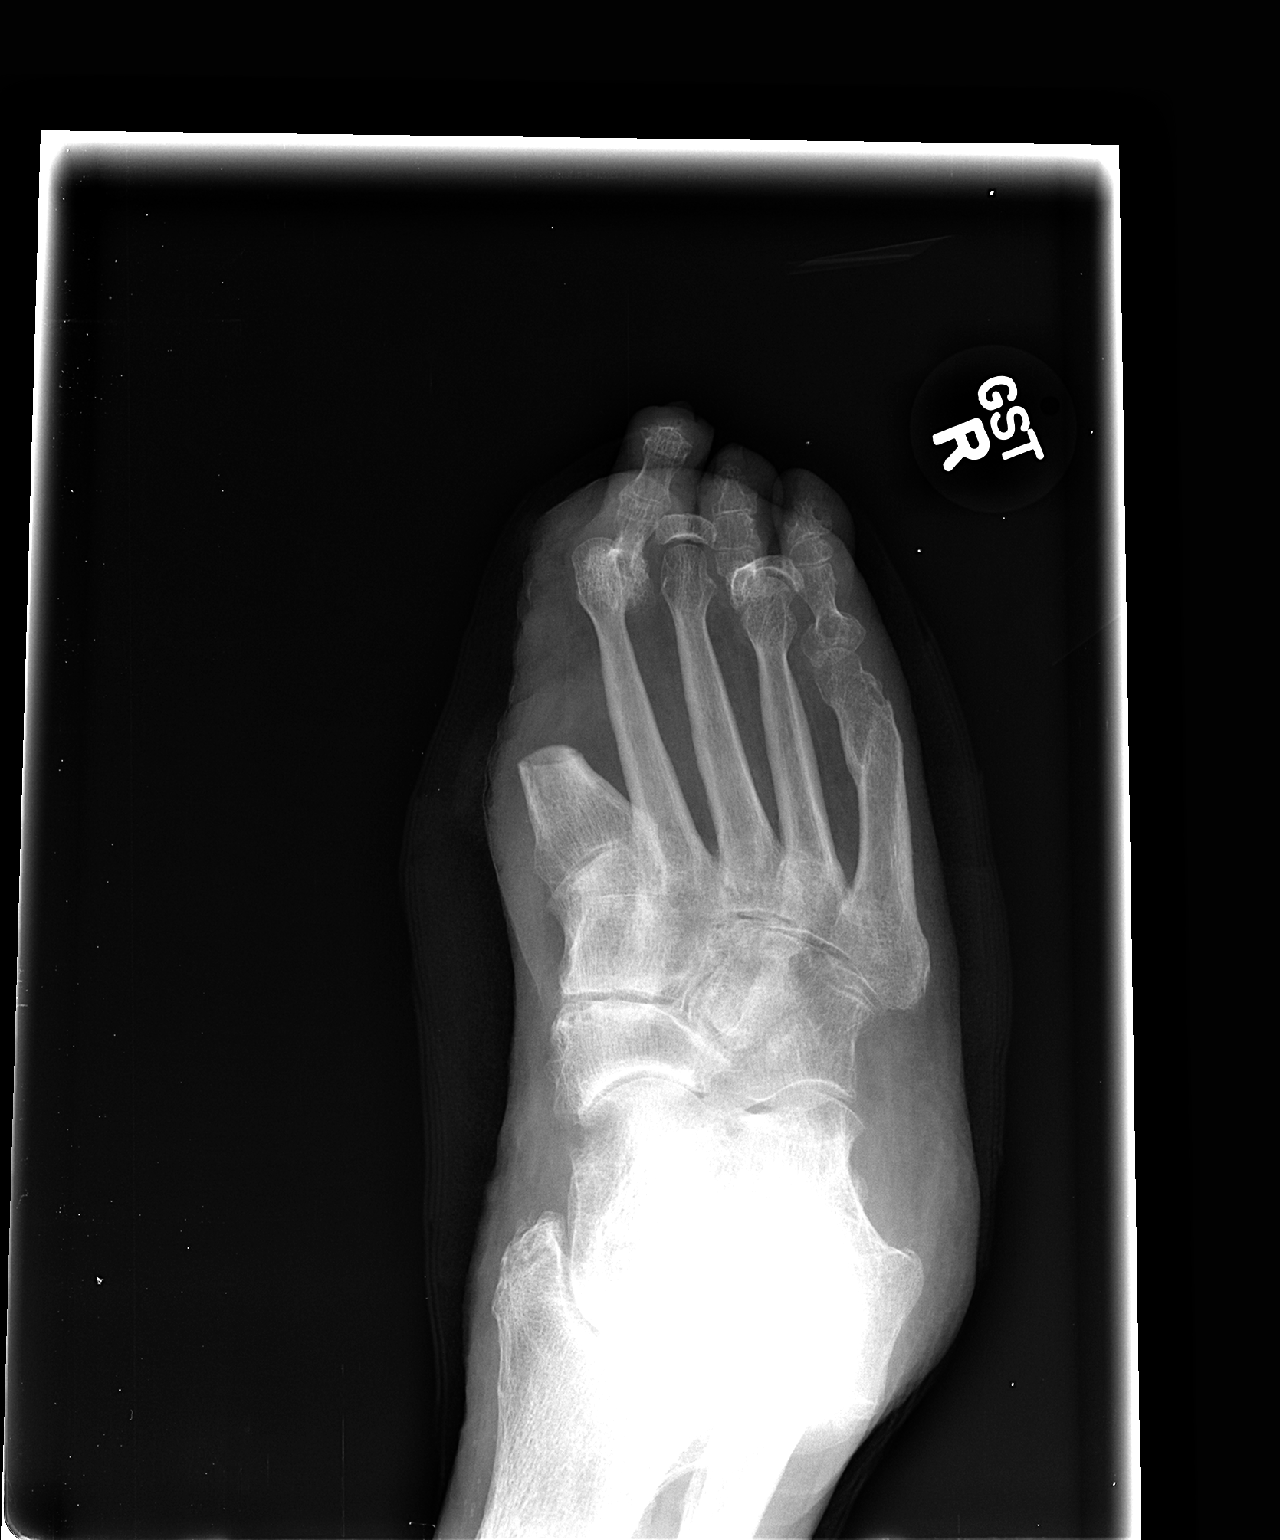

[view not recorded (3 of 3)]
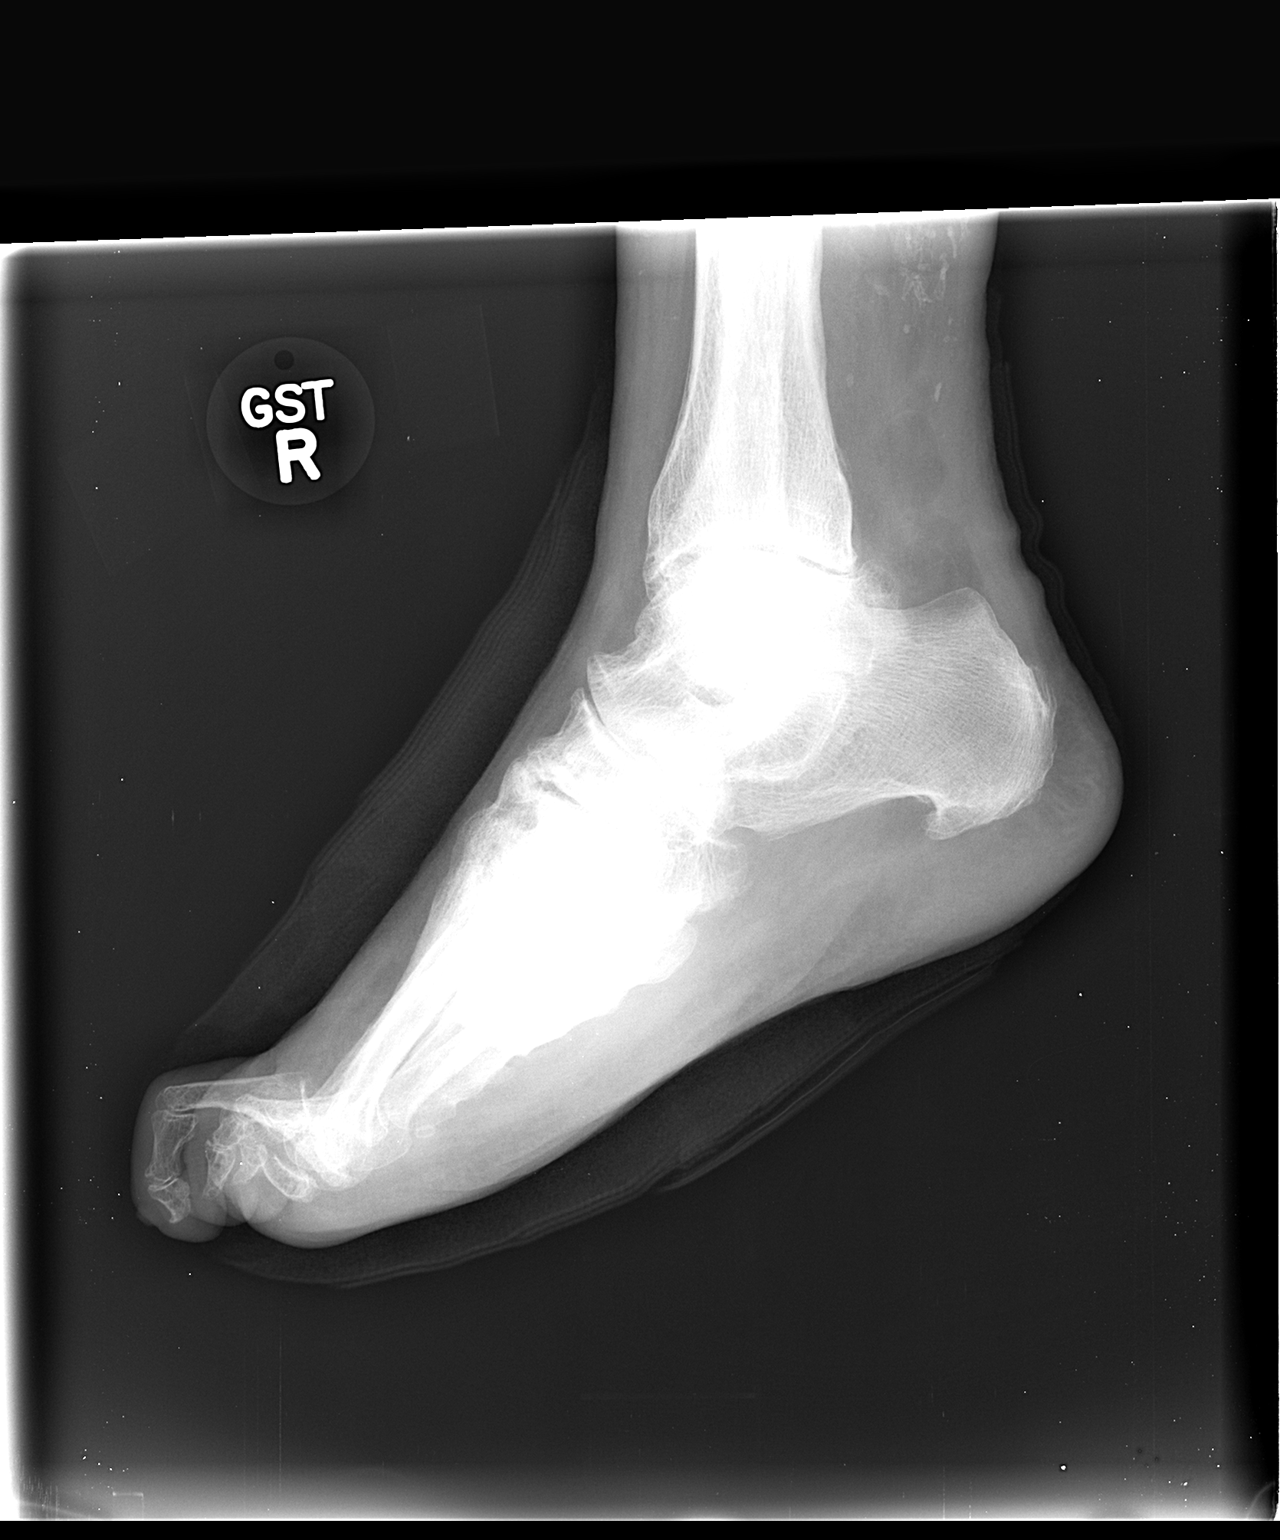

[3 of 3 positions shown; findings below may reference images not displayed]

FINDINGS: Interval amputation mid 1st metatarsal.

Chronic amputation of the 3rd toe proximal phalanx level.

Chronic subluxation 2nd and 4th MTP joints. Chronic fracture 5th
metatarsal

No acute fracture or osteomyelitis. Degenerative changes in the
midfoot and ankle.
IMPRESSION: No acute fracture or osteomyelitis post amputation of the mid 1st
metatarsal.

## 2018-06-04 DEATH — deceased
